# Patient Record
Sex: Male | Born: 1940 | Race: Black or African American | Hispanic: No
Health system: Southern US, Community
[De-identification: ages and names within clinical notes are randomized; demographics above are authoritative.]

---

## 1999-11-08 ENCOUNTER — Encounter: Payer: Self-pay | Admitting: *Deleted

## 1999-11-08 ENCOUNTER — Inpatient Hospital Stay (HOSPITAL_COMMUNITY): Admission: EM | Admit: 1999-11-08 | Discharge: 1999-11-10 | Payer: Self-pay | Admitting: Emergency Medicine

## 1999-11-09 ENCOUNTER — Encounter: Payer: Self-pay | Admitting: Family Medicine

## 1999-11-14 ENCOUNTER — Encounter: Admission: RE | Admit: 1999-11-14 | Discharge: 1999-11-14 | Payer: Self-pay | Admitting: Family Medicine

## 1999-11-18 ENCOUNTER — Encounter: Admission: RE | Admit: 1999-11-18 | Discharge: 1999-11-18 | Payer: Self-pay | Admitting: Family Medicine

## 2000-01-12 ENCOUNTER — Ambulatory Visit (HOSPITAL_COMMUNITY): Admission: RE | Admit: 2000-01-12 | Discharge: 2000-01-12 | Payer: Self-pay | Admitting: Sports Medicine

## 2000-01-13 ENCOUNTER — Encounter: Admission: RE | Admit: 2000-01-13 | Discharge: 2000-01-13 | Payer: Self-pay | Admitting: Family Medicine

## 2000-01-18 ENCOUNTER — Encounter: Admission: RE | Admit: 2000-01-18 | Discharge: 2000-01-18 | Payer: Self-pay | Admitting: Family Medicine

## 2000-01-19 ENCOUNTER — Encounter: Payer: Self-pay | Admitting: *Deleted

## 2000-01-19 ENCOUNTER — Ambulatory Visit (HOSPITAL_COMMUNITY): Admission: RE | Admit: 2000-01-19 | Discharge: 2000-01-19 | Payer: Self-pay | Admitting: *Deleted

## 2000-01-27 ENCOUNTER — Ambulatory Visit (HOSPITAL_COMMUNITY): Admission: RE | Admit: 2000-01-27 | Discharge: 2000-01-27 | Payer: Self-pay | Admitting: *Deleted

## 2000-01-27 ENCOUNTER — Encounter: Payer: Self-pay | Admitting: *Deleted

## 2000-07-27 ENCOUNTER — Encounter: Admission: RE | Admit: 2000-07-27 | Discharge: 2000-07-27 | Payer: Self-pay | Admitting: Family Medicine

## 2000-08-28 ENCOUNTER — Encounter: Admission: RE | Admit: 2000-08-28 | Discharge: 2000-08-28 | Payer: Self-pay | Admitting: Family Medicine

## 2000-10-03 ENCOUNTER — Encounter: Admission: RE | Admit: 2000-10-03 | Discharge: 2000-10-03 | Payer: Self-pay | Admitting: Family Medicine

## 2000-12-06 ENCOUNTER — Encounter: Admission: RE | Admit: 2000-12-06 | Discharge: 2000-12-06 | Payer: Self-pay | Admitting: Family Medicine

## 2000-12-31 ENCOUNTER — Encounter: Admission: RE | Admit: 2000-12-31 | Discharge: 2000-12-31 | Payer: Self-pay | Admitting: Family Medicine

## 2001-01-22 ENCOUNTER — Encounter: Admission: RE | Admit: 2001-01-22 | Discharge: 2001-01-22 | Payer: Self-pay | Admitting: Sports Medicine

## 2001-02-20 ENCOUNTER — Ambulatory Visit (HOSPITAL_COMMUNITY): Admission: RE | Admit: 2001-02-20 | Discharge: 2001-02-20 | Payer: Self-pay | Admitting: Family Medicine

## 2001-02-20 ENCOUNTER — Encounter: Admission: RE | Admit: 2001-02-20 | Discharge: 2001-02-20 | Payer: Self-pay | Admitting: Family Medicine

## 2001-03-08 ENCOUNTER — Encounter: Admission: RE | Admit: 2001-03-08 | Discharge: 2001-03-08 | Payer: Self-pay | Admitting: Family Medicine

## 2001-03-08 ENCOUNTER — Ambulatory Visit (HOSPITAL_COMMUNITY): Admission: RE | Admit: 2001-03-08 | Discharge: 2001-03-08 | Payer: Self-pay | Admitting: Sports Medicine

## 2001-04-03 ENCOUNTER — Encounter: Admission: RE | Admit: 2001-04-03 | Discharge: 2001-04-03 | Payer: Self-pay | Admitting: Family Medicine

## 2001-05-22 ENCOUNTER — Encounter: Admission: RE | Admit: 2001-05-22 | Discharge: 2001-05-22 | Payer: Self-pay | Admitting: Family Medicine

## 2001-05-27 ENCOUNTER — Encounter: Admission: RE | Admit: 2001-05-27 | Discharge: 2001-05-27 | Payer: Self-pay | Admitting: Sports Medicine

## 2001-05-27 ENCOUNTER — Encounter: Payer: Self-pay | Admitting: Sports Medicine

## 2001-06-06 ENCOUNTER — Encounter: Admission: RE | Admit: 2001-06-06 | Discharge: 2001-06-06 | Payer: Self-pay | Admitting: Family Medicine

## 2001-07-26 ENCOUNTER — Encounter: Payer: Self-pay | Admitting: Urology

## 2001-07-26 ENCOUNTER — Encounter: Admission: RE | Admit: 2001-07-26 | Discharge: 2001-07-26 | Payer: Self-pay | Admitting: Urology

## 2001-07-31 ENCOUNTER — Ambulatory Visit (HOSPITAL_BASED_OUTPATIENT_CLINIC_OR_DEPARTMENT_OTHER): Admission: RE | Admit: 2001-07-31 | Discharge: 2001-07-31 | Payer: Self-pay | Admitting: Urology

## 2001-07-31 ENCOUNTER — Encounter (INDEPENDENT_AMBULATORY_CARE_PROVIDER_SITE_OTHER): Payer: Self-pay | Admitting: Specialist

## 2001-08-19 ENCOUNTER — Encounter: Admission: RE | Admit: 2001-08-19 | Discharge: 2001-08-19 | Payer: Self-pay | Admitting: Sports Medicine

## 2001-08-28 ENCOUNTER — Encounter: Admission: RE | Admit: 2001-08-28 | Discharge: 2001-08-28 | Payer: Self-pay | Admitting: Family Medicine

## 2001-09-19 ENCOUNTER — Encounter: Admission: RE | Admit: 2001-09-19 | Discharge: 2001-09-19 | Payer: Self-pay | Admitting: Family Medicine

## 2002-02-24 ENCOUNTER — Encounter: Admission: RE | Admit: 2002-02-24 | Discharge: 2002-02-24 | Payer: Self-pay | Admitting: Sports Medicine

## 2002-02-25 ENCOUNTER — Encounter: Payer: Self-pay | Admitting: Sports Medicine

## 2002-02-25 ENCOUNTER — Encounter: Admission: RE | Admit: 2002-02-25 | Discharge: 2002-02-25 | Payer: Self-pay | Admitting: *Deleted

## 2002-03-07 ENCOUNTER — Encounter: Admission: RE | Admit: 2002-03-07 | Discharge: 2002-03-07 | Payer: Self-pay | Admitting: Family Medicine

## 2002-03-10 ENCOUNTER — Encounter: Admission: RE | Admit: 2002-03-10 | Discharge: 2002-06-08 | Payer: Self-pay | Admitting: *Deleted

## 2002-03-27 ENCOUNTER — Encounter: Admission: RE | Admit: 2002-03-27 | Discharge: 2002-03-27 | Payer: Self-pay | Admitting: Family Medicine

## 2002-07-29 ENCOUNTER — Encounter: Admission: RE | Admit: 2002-07-29 | Discharge: 2002-10-27 | Payer: Self-pay | Admitting: *Deleted

## 2002-08-14 ENCOUNTER — Encounter: Admission: RE | Admit: 2002-08-14 | Discharge: 2002-08-14 | Payer: Self-pay | Admitting: Family Medicine

## 2002-09-25 ENCOUNTER — Encounter: Admission: RE | Admit: 2002-09-25 | Discharge: 2002-09-25 | Payer: Self-pay | Admitting: Sports Medicine

## 2003-05-05 ENCOUNTER — Encounter: Admission: RE | Admit: 2003-05-05 | Discharge: 2003-05-05 | Payer: Self-pay | Admitting: Family Medicine

## 2003-05-05 ENCOUNTER — Ambulatory Visit (HOSPITAL_COMMUNITY): Admission: RE | Admit: 2003-05-05 | Discharge: 2003-05-05 | Payer: Self-pay | Admitting: Sports Medicine

## 2003-05-14 ENCOUNTER — Encounter: Admission: RE | Admit: 2003-05-14 | Discharge: 2003-08-12 | Payer: Self-pay | Admitting: Family Medicine

## 2003-11-24 ENCOUNTER — Encounter: Admission: RE | Admit: 2003-11-24 | Discharge: 2003-11-24 | Payer: Self-pay | Admitting: Family Medicine

## 2004-02-02 ENCOUNTER — Ambulatory Visit: Payer: Self-pay | Admitting: Sports Medicine

## 2004-02-02 ENCOUNTER — Ambulatory Visit (HOSPITAL_COMMUNITY): Admission: RE | Admit: 2004-02-02 | Discharge: 2004-02-02 | Payer: Self-pay | Admitting: Sports Medicine

## 2004-02-23 ENCOUNTER — Encounter: Admission: RE | Admit: 2004-02-23 | Discharge: 2004-02-23 | Payer: Self-pay | Admitting: Sports Medicine

## 2004-03-22 ENCOUNTER — Ambulatory Visit: Payer: Self-pay | Admitting: Family Medicine

## 2004-03-29 ENCOUNTER — Ambulatory Visit: Payer: Self-pay | Admitting: Sports Medicine

## 2004-08-03 ENCOUNTER — Ambulatory Visit: Payer: Self-pay | Admitting: Sports Medicine

## 2004-12-20 ENCOUNTER — Ambulatory Visit: Payer: Self-pay | Admitting: Sports Medicine

## 2004-12-22 ENCOUNTER — Encounter: Admission: RE | Admit: 2004-12-22 | Discharge: 2004-12-22 | Payer: Self-pay | Admitting: Sports Medicine

## 2005-01-17 ENCOUNTER — Ambulatory Visit: Payer: Self-pay | Admitting: Sports Medicine

## 2005-01-23 LAB — CONVERTED CEMR LAB: LDL Cholesterol: 109 mg/dL

## 2005-02-01 ENCOUNTER — Ambulatory Visit: Payer: Self-pay | Admitting: Family Medicine

## 2005-03-22 ENCOUNTER — Ambulatory Visit (HOSPITAL_COMMUNITY): Admission: RE | Admit: 2005-03-22 | Discharge: 2005-03-22 | Payer: Self-pay | Admitting: Sports Medicine

## 2005-03-28 ENCOUNTER — Ambulatory Visit: Payer: Self-pay | Admitting: Sports Medicine

## 2006-06-21 DIAGNOSIS — K219 Gastro-esophageal reflux disease without esophagitis: Secondary | ICD-10-CM | POA: Insufficient documentation

## 2006-06-21 DIAGNOSIS — K449 Diaphragmatic hernia without obstruction or gangrene: Secondary | ICD-10-CM | POA: Insufficient documentation

## 2006-06-21 DIAGNOSIS — E119 Type 2 diabetes mellitus without complications: Secondary | ICD-10-CM

## 2006-06-21 DIAGNOSIS — K4090 Unilateral inguinal hernia, without obstruction or gangrene, not specified as recurrent: Secondary | ICD-10-CM | POA: Insufficient documentation

## 2006-09-01 IMAGING — CR DG CHEST 2V
2 series · 2 of 2 positions shown · non-contrast
Comparison: none

CLINICAL DATA: Cough.  Left sided chest pain.
 CHEST ? TWO VIEWS
 Two views of the chest are compared to a chest x-ray of 07/26/01.  There is no change in slight elevation of the left hemidiaphragm.  No active infiltrate or effusion is seen.  The heart is within normal limits in size.  No bony abnormality is noted.
 IMPRESSION
 Stable chest x-ray.  No active lung disease.

[view not recorded (1 of 2)]
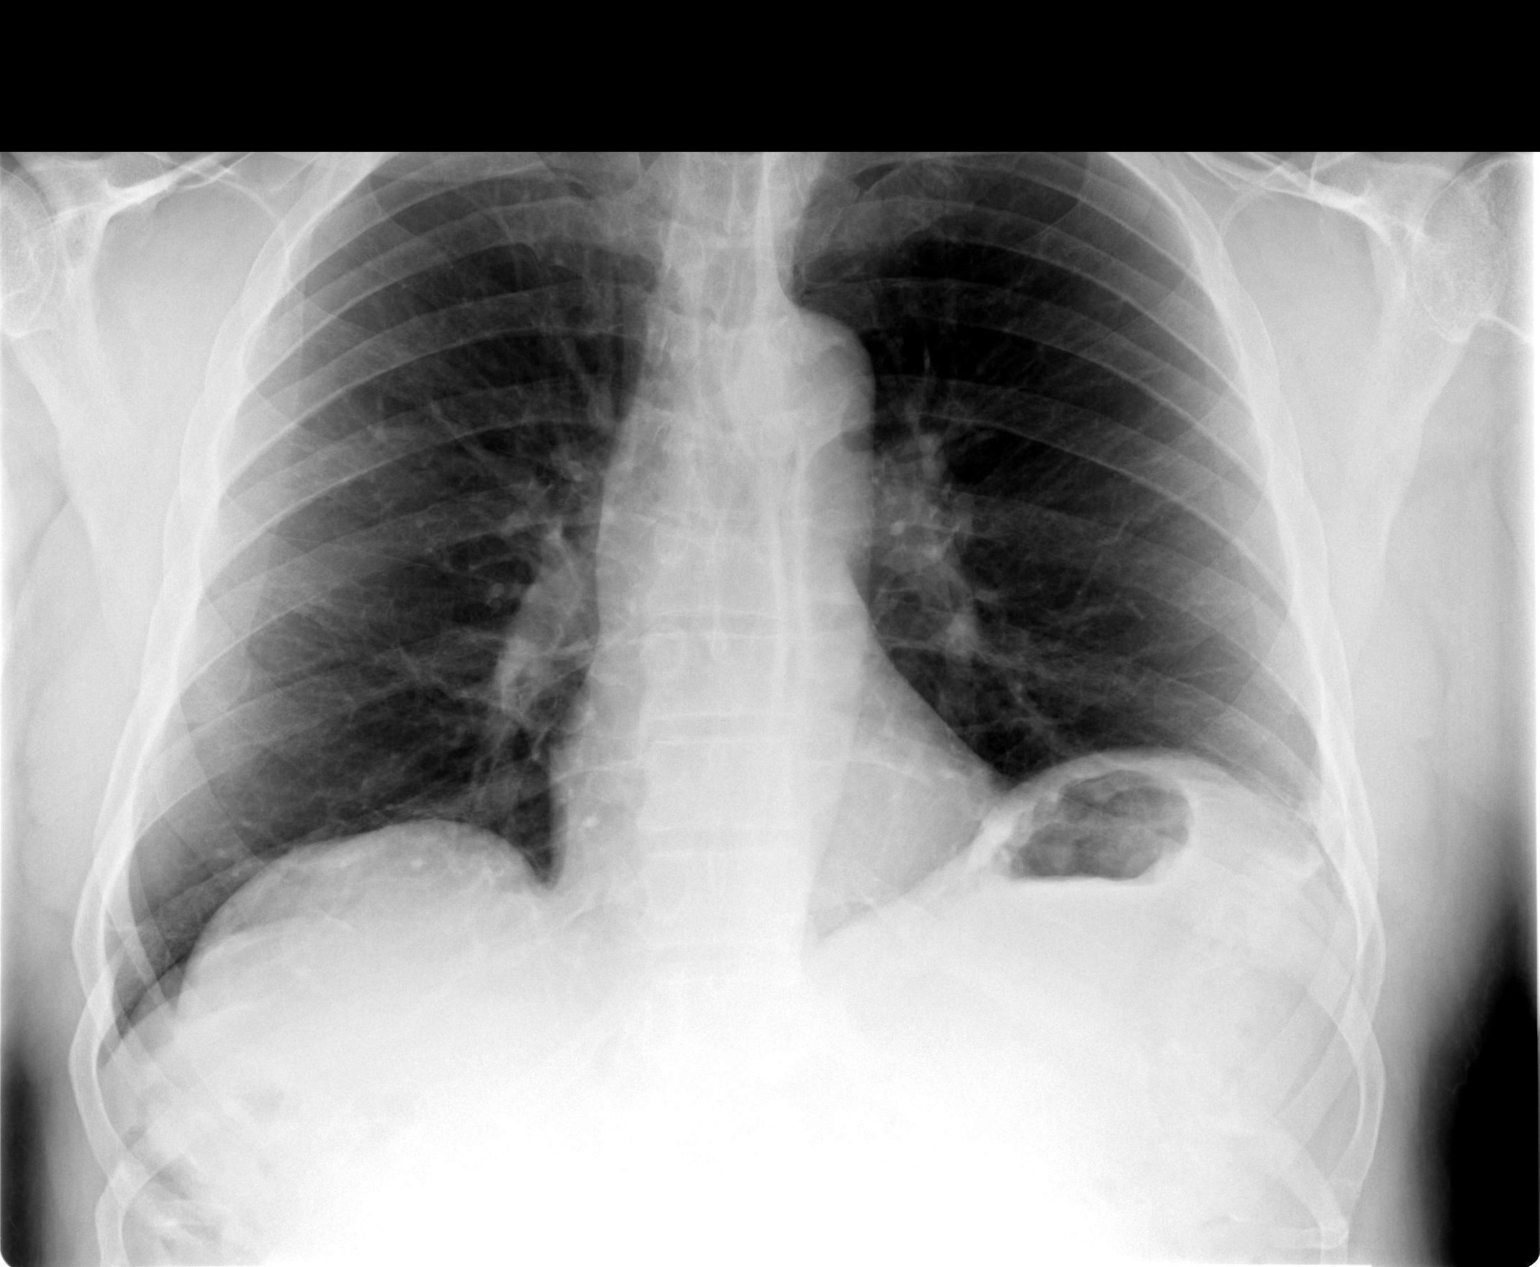

[view not recorded (2 of 2)]
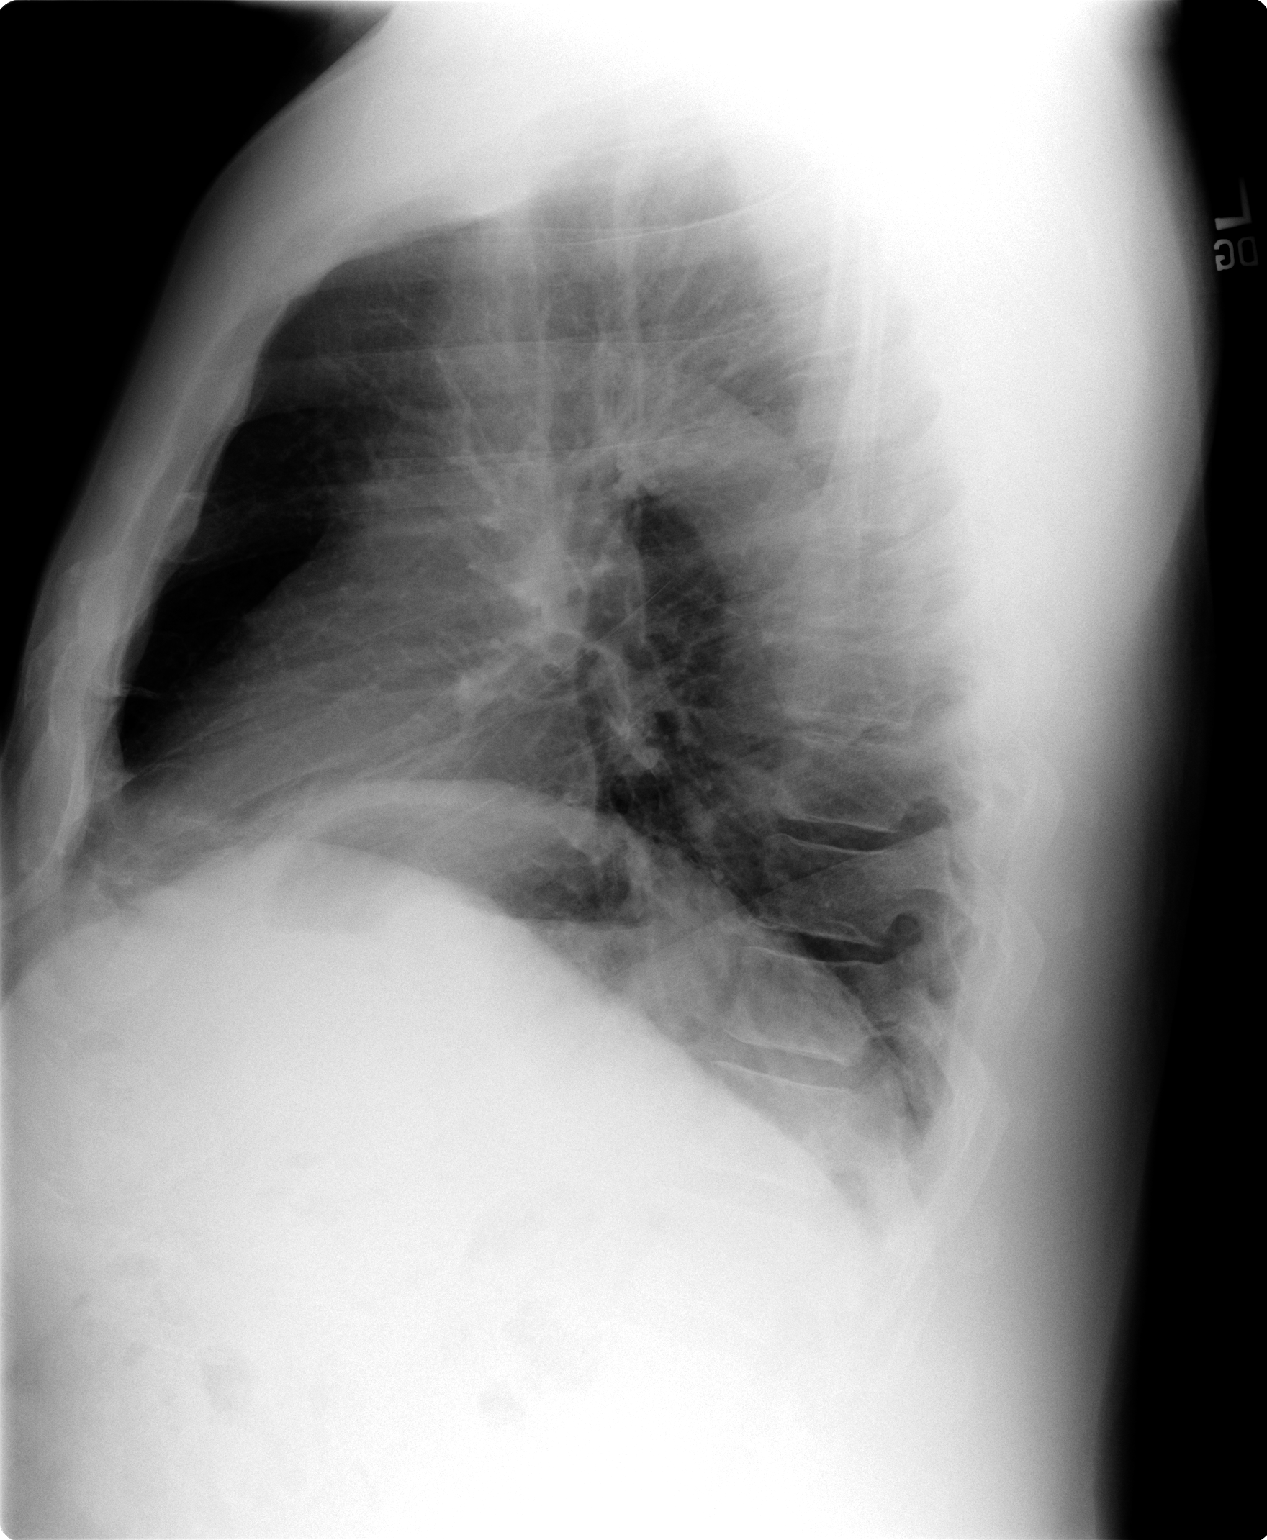

[2 of 2 positions shown; findings below may reference images not displayed]

## 2006-11-26 ENCOUNTER — Encounter: Payer: Self-pay | Admitting: Family Medicine

## 2007-02-08 ENCOUNTER — Telehealth: Payer: Self-pay | Admitting: *Deleted

## 2007-03-01 ENCOUNTER — Telehealth: Payer: Self-pay | Admitting: Family Medicine

## 2007-08-09 ENCOUNTER — Encounter: Payer: Self-pay | Admitting: Family Medicine

## 2007-08-12 ENCOUNTER — Telehealth: Payer: Self-pay | Admitting: Family Medicine

## 2007-09-11 ENCOUNTER — Telehealth: Payer: Self-pay | Admitting: *Deleted

## 2007-09-11 ENCOUNTER — Ambulatory Visit: Payer: Self-pay | Admitting: Family Medicine

## 2007-09-11 DIAGNOSIS — E039 Hypothyroidism, unspecified: Secondary | ICD-10-CM | POA: Insufficient documentation

## 2007-09-11 DIAGNOSIS — H532 Diplopia: Secondary | ICD-10-CM

## 2007-09-11 LAB — CONVERTED CEMR LAB
BUN: 13 mg/dL (ref 6–23)
CO2: 20 meq/L (ref 19–32)
Calcium: 9.2 mg/dL (ref 8.4–10.5)
Creatinine, Ser: 1.12 mg/dL (ref 0.40–1.50)

## 2007-09-12 ENCOUNTER — Encounter: Payer: Self-pay | Admitting: Family Medicine

## 2007-09-17 ENCOUNTER — Telehealth: Payer: Self-pay | Admitting: *Deleted

## 2007-10-07 ENCOUNTER — Encounter: Payer: Self-pay | Admitting: Family Medicine

## 2007-11-25 ENCOUNTER — Telehealth: Payer: Self-pay | Admitting: Family Medicine

## 2007-11-26 ENCOUNTER — Encounter: Payer: Self-pay | Admitting: Family Medicine

## 2007-12-23 ENCOUNTER — Ambulatory Visit: Payer: Self-pay | Admitting: Family Medicine

## 2008-04-22 ENCOUNTER — Encounter: Payer: Self-pay | Admitting: Family Medicine

## 2008-04-22 DIAGNOSIS — E785 Hyperlipidemia, unspecified: Secondary | ICD-10-CM

## 2008-08-05 ENCOUNTER — Ambulatory Visit: Payer: Self-pay | Admitting: Family Medicine

## 2008-08-05 ENCOUNTER — Encounter: Payer: Self-pay | Admitting: Family Medicine

## 2008-08-05 LAB — CONVERTED CEMR LAB
Alkaline Phosphatase: 64 units/L (ref 39–117)
BUN: 13 mg/dL (ref 6–23)
Creatinine, Ser: 1.33 mg/dL (ref 0.40–1.50)
Glucose, Bld: 185 mg/dL — ABNORMAL HIGH (ref 70–99)
HDL: 35 mg/dL — ABNORMAL LOW (ref >39)
LDL Cholesterol: 141 mg/dL — ABNORMAL HIGH (ref 0–99)
Sodium: 138 meq/L (ref 135–145)
Total Bilirubin: 0.3 mg/dL (ref 0.3–1.2)
Total CHOL/HDL Ratio: 7.1
Total Protein: 6.8 g/dL (ref 6.0–8.3)
Triglycerides: 363 mg/dL — ABNORMAL HIGH (ref <150)
VLDL: 73 mg/dL — ABNORMAL HIGH (ref 0–40)

## 2008-08-06 ENCOUNTER — Encounter: Payer: Self-pay | Admitting: Family Medicine

## 2008-09-01 ENCOUNTER — Telehealth (INDEPENDENT_AMBULATORY_CARE_PROVIDER_SITE_OTHER): Payer: Self-pay | Admitting: *Deleted

## 2008-09-14 ENCOUNTER — Telehealth: Payer: Self-pay | Admitting: Family Medicine

## 2010-09-09 NOTE — Op Note (Signed)
[facility]  Patient:    Samuel Holloway, Samuel Holloway Visit Number: [id number] MRN: [id number]          Service Type: NES Location: [facility] Attending Physician:  Thermon Leyland Dictated by:   Barron Alvine, M.D. Proc. Date: 07/31/01 Admit Date:  07/31/2001                             Operative Report  PREOPERATIVE DIAGNOSES: 1. Left spermatocele. 2. Phimosis.  POSTOPERATIVE DIAGNOSES: 1. Left spermatocele. 2. Phimosis.  PROCEDURE PERFORMED:  Circumcision and scrotal exploration with spermatocele excision and partial epididymectomy.  SURGEON:  Barron Alvine, M.D.  ANESTHESIA:  General.  INDICATIONS:  The patient is a 70 year old male.  He presented recently with some complaints of swelling and intermittent discomfort in the left hemiscrotum.  An ultrasound had showed a large spermatocele in the head of the left epididymis.  In addition, the patient had had difficulty for some time with inability to retract his foreskin.  We discussed the entire situation with the patient and strongly considered spermatocele excision with circumcision.  We outlined the advantages as well as the potential risks of this approach.  The patient wanted to proceed with both procedures and full informed consent was obtained.  TECHNIQUE AND FINDINGS:  The patient was brought to the operating room where he had the successful induction of general anesthesia.  He was prepped and draped in the usual manner.  A left mid scrotal incision was performed.  There was a small hydrocele present.  The left testicle was brought out with the epididymis.  The testicle itself was palpably normal.  There was approximately a 4 cm cystic mass superior to the left testicle within the head of the epididymis.  Careful inspection revealed a multilocular spermatocele with some potential cystic degeneration of the head of the epididymis.  We performed a spermatic cord block with Marcaine to lessen  anesthetic requirements.  We also performed a penile block at the same time in anticipation of subsequent circumcision.  Utilizing a combination of sharp and blunt dissection technique, we were able to take off the head of the epididymis containing the spermatocele.  This was really quite entwined in the head of the epididymis and we felt that partial epididymectomy was in his best interest.  We transected the epididymis and suture ligated that.  The very distal portion of the head of the epididymis and this loculated spermatocele were then sent for pathologic confirmation. There was no significant bleeding or other complications or findings.  Care was taken to bring back the testicle into the hemiscrotum, being careful not to have twisted the spermatic cord.  Dartos was closed with interrupted Vicryl and the skin was closed with some interrupted Vicryl.  Attention was then turned to the circumcision.  We were unable to retract his foreskin.  We made a circumferential incision behind the glans penis.  We then performed a dorsal slit and were able to retract the foreskin.  The inner aspect of the penis showed some chronic inflammatory changes with some thickening and pigmentary changes on the glans penis.  We reprepped this area. We then made a separate circumferential incision in the mucosal collar and removed the sleeve of redundant tissue.  Penile skin was then reapproximated with some interrupted Vicryl suture.  A light pressure dressing was applied.  The patient was brought to the recovery room in stable condition and without obvious complications or problems. Dictated  by:   Barron Alvine, M.D. Attending Physician:  Thermon Leyland DD:  07/31/01 TD:  08/01/01 Job: [id number]

## 2010-09-09 NOTE — H&P (Signed)
[place]  Patient:    Samuel Holloway, Samuel Holloway                        MRN: [id number] Adm. Date:  [id number] Disc. Date: [id number] Attending:  Willow Ora Dictator:   Raynelle Jan, M.D.                         History and Physical  PROBLEM LIST: 1. New onset congestive heart failure. 2. Dyspnea on exertion and orthopnea. 3. Lower extremity edema. 4. Gastroesophageal reflux disease with a hiatal hernia.  HISTORY OF PRESENT ILLNESS:  This is a 70 year old African-American male with no previous cardiac history who presents to the ED with a one month history of increasing pedal edema and dyspnea on exertion.  He has had some cough at night and orthopnea.  Sleeping on three pillows for the past few months.  He has experienced some intermittent chest pain; however, this is worse on movement and bending. He has a history of a hiatal hernia and has been having increased reflux and heart burn after eating for several months.  PAST MEDICAL HISTORY:  He had a hiatal hernia that was diagnosed in the 58s with gastroesophageal reflux disease.  He is on no current medications for his GERD.  He is status post an appendectomy three years ago and an inguinal hernia repair in the 60s.  MEDICATIONS:  He takes a multivitamin p.o. q.d. and Maalox p.r.n.  ALLERGIES:  He has no known drug allergies.  FAMILY HISTORY:  His mother had coronary artery disease and diabetes.  His father died of natural causes.  SOCIAL HISTORY:  He works at a [employer]; however, is a nonsmoker, nondrinker and denies drug uses.  He is divorced with three grown children.  REVIEW OF SYSTEMS:  He denies fevers, or chills; however, he has had a 15 pound weight gain over the past two weeks.  He denies any palpitations or chest pain. His respiratory is as per the HPI.  He notes occasional left sided abdominal pain that is intermittent in nature and some heart burn as well as occasional  constipation.  He complaints of dysuria but no hematuria and some occasional nocturia.  He has occasional left arm pain; however, only with certain arm positions. He notes no skin rashes or further neurological deficits.  PHYSICAL EXAMINATION:  VITAL SIGNS:   Temperature 99.0, blood pressure 116/68, pulse 106, respirations 16.  He is saturating 94% on room air.  GENERAL:  This is a pleasant gentleman in no apparent distress who is alert and oriented times four.  HEENT:  Pupils are equal, round and reactive to light.  Extraocular movements intact. He has no scleral icterus.  His oropharynx has dentures in place without erythema or exudate.  NECK:  Supple without JVD or lymphadenopathy.  CARDIOVASCULAR:  He has normal S1 and S2 with a regular rate and rhythm.  No murmurs, rubs or gallops.  LUNGS:  Clear to auscultation bilaterally with no rales, rhonchi, or wheezing.  ABDOMEN:  Soft, nontender, and nondistended.  Positive bowel sounds.  No masses.  EXTREMITIES:  He has 2 to 3+ pitting edema in the bilateral lower extremities to the mid calf.  Pulses are 2+. He has no cyanosis or clubbing.  NEUROLOGIC:  He has no focal deficits.  Musculoskeletal exam is 5/5 throughout.  LABORATORY DATA:  His CBC was within normal limits. His basic  metabolic panel was significant only for glucose of 162.  His pH was 7.449 with a pCO2 of 36.3 and a bicarbonate of 25.  Initial cardiac enzymes showed a CK of 149, MB fraction of 1.0, index of 0.7, troponin less than 0.03.  EKG showed a normal sinus rhythm with insignificant Qs and V4 through V6.  His chest x-ray showed no edema.  No active disease.  ASSESSMENT/PLAN:  This is a 70 year old African-American male with no previous cardiac history who presents with dyspnea on exertion, shortness of breath and pedal edema, most consistent with a congestive heart failure exacerbation.  1. Congestive heart failure.  We will admit, rule out for  myocardial    infarction.  Check an echocardiogram in the a.m. and place him on    telemetry.  He will receive 40 of Lasix intravenous now and will repeat    this later in the evening based on saturation.  Will check pulmonary    function tests given his history and clinical findings consistent more    with right sided failure and will recheck his chest x-ray in the    morning. He will also evaluate his cardiac risk factors further by    checking a fasting lipid panel. 2. Gastroesophageal reflux disease.  We will start him on Pepcid to see    if he has relief.  However, if his gastroesophageal reflux disease    persists we will increase his therapy to a proton pump inhibitor. DD:  11/12/99 TD:  11/13/99 Job: [id number]

## 2010-09-09 NOTE — Discharge Summary (Signed)
[place]. [facility]  Patient:    Samuel Holloway, Samuel Holloway                        MRN: [id number] Adm. Date:  [id number] Disc. Date: [id number] Attending:  Asencion Partridge L Dictator:   Ilene Qua, M.D.                           Discharge Summary  DATE OF BIRTH:  02/14/1941  DISCHARGE DIAGNOSES: 1. Lower extremity edema. 2. Increasing dyspnea on exertion with normal ejection fraction by    echocardiogram on November 09, 1999. 3. Impaired fasting glucose.  PROCEDURES: 1. Transthoracic echocardiogram on November 09, 1999, which revealed a left    ventricular ejection fraction of 60%, a normal right ventricular ejection    fraction, mildly dilated left atrium, and trace mitral regurgitation. 2. 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  CONDITION ON DISCHARGE:  Good.  DISPOSITION:  The patient was discharged to home.  DISCHARGE MEDICATIONS: 1. Lasix 20 mg p.o. q.d. 2. Pepcid 20 mg p.o. b.i.d.  ACTIVITY:  The patient is to increase his level of activity as tolerated.  DIET:  He is to increase his intake of high potassium foods like bananas, etc., as well as try to decrease his consumption of high sugar containing foods.  FOLLOW-UP:  He has a follow-up appointment at the family practice center on Friday, November 18, 1999, at 1:45 p.m. with myself, Ilene Qua, M.D.  HISTORY OF PRESENT ILLNESS:  This patient is a 70 year old African-American male with no cardiac history, who presents to the emergency department with one-month history of increasing pedal edema and increasing dyspnea on exertion.  He does have a cough at night and also had orthopnea.  He sleeps on three pillows for the past few months.  He also stated that he did have intermittent chest pressure, but worse with movement or bending.  PHYSICAL EXAMINATION:  The  temperature was 99.0 degrees, blood pressure 116/68, pulse 106, respiratory rate 16, and he was saturating 94% on room air.  GENERAL APPEARANCE:  He was a pleasant African-American male in no acute distress and alert and oriented.  HEENT:  Pupils equal, round, and reactive to light.  Extraocular movements intact.  No scleral icterus.  The oropharynx was benign with dentures in place.  No erythema or exudate.  NECK:  No JVD or LAD.  CARDIOVASCULAR:  Normal S1 and S2 with a regular rate and rhythm.  No murmurs, rubs, or gallops.  LUNGS:  Clear to auscultation bilaterally.  ABDOMEN:  Soft, nontender, and nondistended with bowel sounds present.  No masses.  EXTREMITIES:  There was 2-3+ pitting edema in the bilateral lower extremities to the mid calf.  Pulses were 2+ bilaterally.  There was no cyanosis or clubbing.  LABORATORY DATA:  The CBC revealed a white count of 3.8, hemoglobin 13.4, hematocrit 39.2, platelet count 169, and a normal differential.  The BMP revealed a sodium of 142, potassium 3.8, chloride 109, bicarbonate 26, BUN 12, creatinine 0.9, and glucose 162.  The CK was 149, the MB was 1.0, and the troponin was less than 0.03.  The EKG revealed normal sinus rhythm with insignificant Qs in V4-V6.  The chest x-ray revealed no edema and no  acute disease.  HOSPITAL COURSE: #1 - QUESTIONABLE NEW ONSET CONGESTIVE HEART FAILURE:  The patient was admitted to rule out for myocardial infarction and was placed on telemetry during his hospital stay.  An echocardiogram was obtained on November 09, 1999, which revealed a normal left ventricular ejection fraction of 60%, as well as normal right ventricular ejection fraction.  The patient was also given Lasix 40 mg IV in the emergency department and had very good diuresis and was given 40 mg p.o. during his hospital stay.  Also, pulmonary function studies were performed given the patients history and clinical findings of being  more consistent with right-sided failure.  These pulmonary function tests revealed that he had some mild obstructive disease.  We also evaluated the cardiac risk factors and checked his fasting lipid profile.  This revealed a cholesterol of 140, triglycerides of 255, an HDL of 22, which was low, and an LDL of 67.  As the patient did not have congestive heart failure by echocardiogram, it was felt that the presentation could represent early onset of cor pulmonale with the symptoms of lower extremity edema and increasing dyspnea on exertion.  He was discharged on a low dose of Lasix 20 mg a day and will be followed up in about a week in the clinic to check a potassium level to see if the swelling has decreased at all.  #2 - GASTROESOPHAGEAL REFLUX DISEASE:  The patient did give a history of hiatal hernia and some epigastric pain.  He was started on Pepcid and this seemed to bring him great relief.  He was discharged on Pepcid 20 mg b.i.d. for this.  #3 - IMPAIRED FASTING GLUCOSE:  The patients glucose on admission was 162.  A fasting glucose the following morning was 118, but due to the patients history of increasing thirst, it was felt that he should have a further work-up of this elevated glucose level as an outpatient.  He was advised to go ahead and watch his intake of foods high in sugar. DD:  11/10/99 TD:  11/14/99 Job: [id number]

## 2022-04-27 ENCOUNTER — Encounter (HOSPITAL_COMMUNITY)
Admission: RE | Admit: 2022-04-27 | Discharge: 2022-04-27 | Disposition: A | Discharge status: Home / Self Care | Payer: No Typology Code available for payment source | Source: Ambulatory Visit | Attending: Nurse Practitioner | Admitting: Nurse Practitioner

## 2022-04-27 VITALS — BP 100/52 | HR 73 | Ht 71.0 in | Wt 191.6 lb

## 2022-04-27 DIAGNOSIS — I2510 Atherosclerotic heart disease of native coronary artery without angina pectoris: Secondary | ICD-10-CM | POA: Insufficient documentation

## 2022-04-27 LAB — GLUCOSE, CAPILLARY: Glucose-Capillary: 175 mg/dL — ABNORMAL HIGH (ref 70–99)

## 2022-04-27 NOTE — Progress Notes (Signed)
Cardiac Individual Treatment Plan  Patient Details  Name: Samuel Holloway. MRN: [id number] Date of Birth: October 06, 1940 Referring Provider:   Flowsheet Row CARDIAC REHAB PHASE II ORIENTATION from 04/27/2022 in [facility]  Referring Provider [name], FNP       Initial Encounter Date:  Flowsheet Row CARDIAC REHAB PHASE II ORIENTATION from 04/27/2022 in [facility]  Date 04/27/22       Visit Diagnosis: Atherosclerosis of native coronary artery of native heart without angina pectoris  Patient's Home Medications on Admission:  Current Outpatient Medications:    aspirin EC 81 MG tablet, Take 81 mg by mouth daily., Disp: , Rfl:    atorvastatin (LIPITOR) 80 MG tablet, Take 40 mg by mouth daily., Disp: , Rfl:    BRILINTA 90 MG TABS tablet, Take 90 mg by mouth 2 times daily at 12 noon and 4 pm., Disp: , Rfl:    Calcium Carb-Cholecalciferol 600-10 MG-MCG TABS, Take 1 tablet by mouth 2 (two) times daily., Disp: , Rfl:    carboxymethylcellulose (REFRESH PLUS) 0.5 % SOLN, Place 1 drop into both eyes at bedtime., Disp: , Rfl:    cholecalciferol (VITAMIN D3) 25 MCG (1000 UNIT) tablet, Take 1,000 Units by mouth daily., Disp: , Rfl:    diclofenac (VOLTAREN) 75 MG EC tablet, Take 75 mg by mouth 2 (two) times daily as needed for mild pain., Disp: , Rfl:    empagliflozin (JARDIANCE) 25 MG TABS tablet, Take 25 mg by mouth daily., Disp: , Rfl:    fluticasone (FLONASE) 50 MCG/ACT nasal spray, Place 1 spray into both nostrils daily., Disp: , Rfl:    hydrochlorothiazide (HYDRODIURIL) 25 MG tablet, Take 12.5 mg by mouth daily., Disp: , Rfl:    levothyroxine (SYNTHROID) 200 MCG tablet, Take 200 mcg by mouth daily before breakfast., Disp: , Rfl:    LYCOPENE PO, Take 1 tablet by mouth daily., Disp: , Rfl:    metFORMIN (GLUCOPHAGE) 1000 MG tablet, Take 1,000 mg by mouth 2 (two) times daily with a meal., Disp: , Rfl:    omeprazole (PRILOSEC) 40 MG capsule, Take 40  mg by mouth daily., Disp: , Rfl:    polyethylene glycol powder (GLYCOLAX/MIRALAX) 17 GM/SCOOP powder, Take 17 g by mouth daily as needed for mild constipation., Disp: , Rfl:    senna-docusate (SENOKOT-S) 8.6-50 MG tablet, Take 2 tablets by mouth at bedtime as needed for mild constipation., Disp: , Rfl:    sertraline (ZOLOFT) 100 MG tablet, Take 100 mg by mouth daily., Disp: , Rfl:    sodium chloride (OCEAN) 0.65 % nasal spray, Place 1 spray into the nose every 2 (two) hours., Disp: , Rfl:    Timolol Maleate, Once-Daily, 0.5 % SOLN, Apply 1 drop to eye daily., Disp: , Rfl:    isosorbide mononitrate (IMDUR) 30 MG 24 hr tablet, Take 30 mg by mouth daily., Disp: , Rfl:    metoprolol succinate (TOPROL-XL) 100 MG 24 hr tablet, Take 50 mg by mouth daily., Disp: , Rfl:    oxybutynin (DITROPAN) 5 MG tablet, Take 5 mg by mouth 2 (two) times daily., Disp: , Rfl:   Past Medical History: No past medical history on file.  Tobacco Use: Social History   Tobacco Use  Smoking Status Not on file  Smokeless Tobacco Not on file    Labs: Review Flowsheet       Latest Ref Rng & Units 01/23/2005 08/05/2008  Labs for ITP Cardiac and Pulmonary Rehab  Cholestrol 0 - 200  mg/dL - 249   LDL (calc) 0 - 99 mg/dL 109  141   HDL-C >39 mg/dL - 35   Trlycerides <150 mg/dL - 363   Hemoglobin A1c % - 6.4     Capillary Blood Glucose: Lab Results  Component Value Date   GLUCAP 175 (H) 04/27/2022    POCT Glucose     Row Name 04/27/22 1024             POCT Blood Glucose   Pre-Exercise 175 mg/dL                Exercise Target Goals: Exercise Program Goal: Individual exercise prescription set using results from initial 6 min walk test and THRR while considering  patient's activity barriers and safety.   Exercise Prescription Goal: Starting with aerobic activity 30 plus minutes a day, 3 days per week for initial exercise prescription. Provide home exercise prescription and guidelines that participant  acknowledges understanding prior to discharge.  Activity Barriers & Risk Stratification:  Activity Barriers & Cardiac Risk Stratification - 04/27/22 0838       Activity Barriers & Cardiac Risk Stratification   Activity Barriers Arthritis;Back Problems;Neck/Spine Problems;Joint Problems;Deconditioning;Muscular Weakness;Balance Concerns;History of Falls;Assistive Device    Cardiac Risk Stratification High             6 Minute Walk:  6 Minute Walk     Row Name 04/27/22 1020         6 Minute Walk   Phase Initial     Distance 1050 feet     Walk Time 6 minutes     # of Rest Breaks 0     MPH 1.99     METS 1.84     RPE 9     VO2 Peak 6.45     Symptoms No     Resting HR 73 bpm     Resting BP 100/52     Resting Oxygen Saturation  99 %     Exercise Oxygen Saturation  during 6 min walk 99 %     Max Ex. HR 86 bpm     Max Ex. BP 122/60     2 Minute Post BP 102/60              Oxygen Initial Assessment:   Oxygen Re-Evaluation:   Oxygen Discharge (Final Oxygen Re-Evaluation):   Initial Exercise Prescription:  Initial Exercise Prescription - 04/27/22 1000       Date of Initial Exercise RX and Referring Provider   Date 04/27/22    Referring Provider [name], FNP    Expected Discharge Date 07/21/22      NuStep   Level 1    SPM 80    Minutes 22      Arm Ergometer   Level 1    RPM 60    Minutes 17      Prescription Details   Frequency (times per week) 3    Duration Progress to 30 minutes of continuous aerobic without signs/symptoms of physical distress      Intensity   THRR 40-80% of Max Heartrate 56-112    Ratings of Perceived Exertion 11-13    Perceived Dyspnea 0-4      Resistance Training   Training Prescription Yes    Weight 4    Reps 10-15             Perform Capillary Blood Glucose checks as needed.  Exercise Prescription Changes:   Exercise Comments:   Exercise Goals  and Review:   Exercise Goals     Row Name  04/27/22 1023             Exercise Goals   Increase Physical Activity Yes       Intervention Provide advice, education, support and counseling about physical activity/exercise needs.;Develop an individualized exercise prescription for aerobic and resistive training based on initial evaluation findings, risk stratification, comorbidities and participant's personal goals.       Expected Outcomes Short Term: Attend rehab on a regular basis to increase amount of physical activity.;Long Term: Add in home exercise to make exercise part of routine and to increase amount of physical activity.;Long Term: Exercising regularly at least 3-5 days a week.       Increase Strength and Stamina Yes       Intervention Provide advice, education, support and counseling about physical activity/exercise needs.;Develop an individualized exercise prescription for aerobic and resistive training based on initial evaluation findings, risk stratification, comorbidities and participant's personal goals.       Expected Outcomes Short Term: Increase workloads from initial exercise prescription for resistance, speed, and METs.;Short Term: Perform resistance training exercises routinely during rehab and add in resistance training at home;Long Term: Improve cardiorespiratory fitness, muscular endurance and strength as measured by increased METs and functional capacity (6MWT)       Able to understand and use rate of perceived exertion (RPE) scale Yes       Intervention Provide education and explanation on how to use RPE scale       Expected Outcomes Short Term: Able to use RPE daily in rehab to express subjective intensity level;Long Term:  Able to use RPE to guide intensity level when exercising independently       Knowledge and understanding of Target Heart Rate Range (THRR) Yes       Intervention Provide education and explanation of THRR including how the numbers were predicted and where they are located for reference        Expected Outcomes Short Term: Able to state/look up THRR;Short Term: Able to use daily as guideline for intensity in rehab;Long Term: Able to use THRR to govern intensity when exercising independently       Able to check pulse independently Yes       Intervention Provide education and demonstration on how to check pulse in carotid and radial arteries.;Review the importance of being able to check your own pulse for safety during independent exercise       Expected Outcomes Short Term: Able to explain why pulse checking is important during independent exercise;Long Term: Able to check pulse independently and accurately       Understanding of Exercise Prescription Yes       Intervention Provide education, explanation, and written materials on patient's individual exercise prescription       Expected Outcomes Short Term: Able to explain program exercise prescription;Long Term: Able to explain home exercise prescription to exercise independently                Exercise Goals Re-Evaluation :    Discharge Exercise Prescription (Final Exercise Prescription Changes):   Nutrition:  Target Goals: Understanding of nutrition guidelines, daily intake of sodium 1500mg , cholesterol 200mg , calories 30% from fat and 7% or less from saturated fats, daily to have 5 or more servings of fruits and vegetables.  Biometrics:  Pre Biometrics - 04/27/22 1023       Pre Biometrics   Height 5\' 11"  (1.803 m)  Weight 191 lb 9.3 oz (86.9 kg)    Waist Circumference 40.5 inches    Hip Circumference 43 inches    Waist to Hip Ratio 0.94 %    BMI (Calculated) 26.73    Triceps Skinfold 23 mm    % Body Fat 29.3 %    Grip Strength 23.1 kg    Flexibility 0 in    Single Leg Stand 0 seconds              Nutrition Therapy Plan and Nutrition Goals:  Nutrition Therapy & Goals - 04/27/22 0842       Intervention Plan   Intervention Nutrition handout(s) given to patient.    Expected Outcomes Short Term Goal:  Understand basic principles of dietary content, such as calories, fat, sodium, cholesterol and nutrients.             Nutrition Assessments:  Nutrition Assessments - 04/27/22 0850       MEDFICTS Scores   Pre Score 51            MEDIFICTS Score Key: ?70 Need to make dietary changes  40-70 Heart Healthy Diet ? 40 Therapeutic Level Cholesterol Diet   Picture Your Plate Scores: D34-534 Unhealthy dietary pattern with much room for improvement. 41-50 Dietary pattern unlikely to meet recommendations for good health and room for improvement. 51-60 More healthful dietary pattern, with some room for improvement.  >60 Healthy dietary pattern, although there may be some specific behaviors that could be improved.    Nutrition Goals Re-Evaluation:   Nutrition Goals Discharge (Final Nutrition Goals Re-Evaluation):   Psychosocial: Target Goals: Acknowledge presence or absence of significant depression and/or stress, maximize coping skills, provide positive support system. Participant is able to verbalize types and ability to use techniques and skills needed for reducing stress and depression.  Initial Review & Psychosocial Screening:  Initial Psych Review & Screening - 04/27/22 0840       Initial Review   Current issues with None Identified      Family Dynamics   Good Support System? Yes    Comments His daughter is his support system.      Barriers   Psychosocial barriers to participate in program There are no identifiable barriers or psychosocial needs.      Screening Interventions   Interventions Encouraged to exercise    Expected Outcomes Long Term goal: The participant improves quality of Life and PHQ9 Scores as seen by post scores and/or verbalization of changes;Short Term goal: Identification and review with participant of any Quality of Life or Depression concerns found by scoring the questionnaire.             Quality of Life Scores:  Quality of Life - 04/27/22  1020       Quality of Life   Select Quality of Life      Quality of Life Scores   Health/Function Pre 22.15 %    Socioeconomic Pre 22.5 %    Psych/Spiritual Pre 27.43 %    Family Pre 19.2 %    GLOBAL Pre 22.97 %            Scores of 19 and below usually indicate a poorer quality of life in these areas.  A difference of  2-3 points is a clinically meaningful difference.  A difference of 2-3 points in the total score of the Quality of Life Index has been associated with significant improvement in overall quality of life, self-image, physical symptoms, and general health in  studies assessing change in quality of life.  PHQ-9: Review Flowsheet       04/27/2022  Depression screen PHQ 2/9  Decreased Interest 0  Down, Depressed, Hopeless 0  PHQ - 2 Score 0  Altered sleeping 0  Tired, decreased energy 1  Change in appetite 1  Feeling bad or failure about yourself  0  Trouble concentrating 0  Moving slowly or fidgety/restless 0  Suicidal thoughts 0  PHQ-9 Score 2  Difficult doing work/chores Somewhat difficult   Interpretation of Total Score  Total Score Depression Severity:  1-4 = Minimal depression, 5-9 = Mild depression, 10-14 = Moderate depression, 15-19 = Moderately severe depression, 20-27 = Severe depression   Psychosocial Evaluation and Intervention:  Psychosocial Evaluation - 04/27/22 1506       Psychosocial Evaluation & Interventions   Interventions Encouraged to exercise with the program and follow exercise prescription;Relaxation education;Stress management education    Comments Pt has no barriers to participating in CR. He has no identifiable psychosocial issues. He scored a 2 on his PHQ-9, and this relates to his poor appetite and his lack of energy. He lives by himself and he frequently goes to [place] to eat out, and due to this he eats a lot of fried foods. He lists his supports system as his daughter. He has several children and grand children who live in  [place], but he only routinely sees one of his daughters. He reports that his goals while in the program are to increase his strength and stamina. He was excited when he was fitted on the machines. He states that he used to exercise and go to the gym a lot when his was in the [employer] in his younger years.    Expected Outcomes Pt will continue to have no identifiable psychosocial issues.    Continue Psychosocial Services  No Follow up required             Psychosocial Re-Evaluation:   Psychosocial Discharge (Final Psychosocial Re-Evaluation):   Vocational Rehabilitation: Provide vocational rehab assistance to qualifying candidates.   Vocational Rehab Evaluation & Intervention:  Vocational Rehab - 04/27/22 0854       Initial Vocational Rehab Evaluation & Intervention   Assessment shows need for Vocational Rehabilitation No      Vocational Rehab Re-Evaulation   Comments retired             Education: Education Goals: Education classes will be provided on a weekly basis, covering required topics. Participant will state understanding/return demonstration of topics presented.  Learning Barriers/Preferences:  Learning Barriers/Preferences - 04/27/22 0850       Learning Barriers/Preferences   Learning Barriers Sight    Learning Preferences Audio;Group Instruction;Individual Instruction;Skilled Demonstration;Pictoral;Verbal Instruction;Written Material             Education Topics: Hypertension, Hypertension Reduction -Define heart disease and high blood pressure. Discus how high blood pressure affects the body and ways to reduce high blood pressure.   Exercise and Your Heart -Discuss why it is important to exercise, the FITT principles of exercise, normal and abnormal responses to exercise, and how to exercise safely.   Angina -Discuss definition of angina, causes of angina, treatment of angina, and how to decrease risk of having angina.   Cardiac  Medications -Review what the following cardiac medications are used for, how they affect the body, and side effects that may occur when taking the medications.  Medications include Aspirin, Beta blockers, calcium channel blockers, ACE Inhibitors, angiotensin receptor blockers, diuretics,  digoxin, and antihyperlipidemics.   Congestive Heart Failure -Discuss the definition of CHF, how to live with CHF, the signs and symptoms of CHF, and how keep track of weight and sodium intake.   Heart Disease and Intimacy -Discus the effect sexual activity has on the heart, how changes occur during intimacy as we age, and safety during sexual activity.   Smoking Cessation / COPD -Discuss different methods to quit smoking, the health benefits of quitting smoking, and the definition of COPD.   Nutrition I: Fats -Discuss the types of cholesterol, what cholesterol does to the heart, and how cholesterol levels can be controlled.   Nutrition II: Labels -Discuss the different components of food labels and how to read food label   Heart Parts/Heart Disease and PAD -Discuss the anatomy of the heart, the pathway of blood circulation through the heart, and these are affected by heart disease.   Stress I: Signs and Symptoms -Discuss the causes of stress, how stress may lead to anxiety and depression, and ways to limit stress.   Stress II: Relaxation -Discuss different types of relaxation techniques to limit stress.   Warning Signs of Stroke / TIA -Discuss definition of a stroke, what the signs and symptoms are of a stroke, and how to identify when someone is having stroke.   Knowledge Questionnaire Score:  Knowledge Questionnaire Score - 04/27/22 0851       Knowledge Questionnaire Score   Pre Score 18/24             Core Components/Risk Factors/Patient Goals at Admission:  Personal Goals and Risk Factors at Admission - 04/27/22 0855       Core Components/Risk Factors/Patient Goals on  Admission   Diabetes Yes    Intervention Provide education about signs/symptoms and action to take for hypo/hyperglycemia.;Provide education about proper nutrition, including hydration, and aerobic/resistive exercise prescription along with prescribed medications to achieve blood glucose in normal ranges: Fasting glucose 65-99 mg/dL    Expected Outcomes Short Term: Participant verbalizes understanding of the signs/symptoms and immediate care of hyper/hypoglycemia, proper foot care and importance of medication, aerobic/resistive exercise and nutrition plan for blood glucose control.;Long Term: Attainment of HbA1C < 7%.    Hypertension Yes    Intervention Provide education on lifestyle modifcations including regular physical activity/exercise, weight management, moderate sodium restriction and increased consumption of fresh fruit, vegetables, and low fat dairy, alcohol moderation, and smoking cessation.;Monitor prescription use compliance.    Expected Outcomes Short Term: Continued assessment and intervention until BP is < 140/60mm HG in hypertensive participants. < 130/87mm HG in hypertensive participants with diabetes, heart failure or chronic kidney disease.;Long Term: Maintenance of blood pressure at goal levels.    Lipids Yes    Intervention Provide education and support for participant on nutrition & aerobic/resistive exercise along with prescribed medications to achieve LDL 70mg , HDL >40mg .    Expected Outcomes Short Term: Participant states understanding of desired cholesterol values and is compliant with medications prescribed. Participant is following exercise prescription and nutrition guidelines.;Long Term: Cholesterol controlled with medications as prescribed, with individualized exercise RX and with personalized nutrition plan. Value goals: LDL < 70mg , HDL > 40 mg.    Personal Goal Other Yes    Personal Goal Increase Strength and stamina    Intervention Attend CR and exercise at home at  least two days per week    Expected Outcomes Pt will meet stated goals.             Core Components/Risk Factors/Patient Goals  Review:    Core Components/Risk Factors/Patient Goals at Discharge (Final Review):    ITP Comments:   Comments: Comments: Patient arrived for 1st visit/orientation/education at 0800. Patient was referred to CR by Concha Pyo, FNP due to atherosclerosis of native coronary artery of native heart without angina pectoris (I25.10). During orientation advised patient on arrival and appointment times what to wear, what to do before, during and after exercise. Reviewed attendance and class policy.  Pt is scheduled to return Cardiac Rehab on 05/01/2022 at 0930. Pt was advised to come to class 15 minutes before class starts.  Discussed RPE/Dpysnea scales. Patient participated in warm up stretches. Patient was able to complete 6 minute walk test.  Telemetry: NSR. Patient was measured for the equipment. Discussed equipment safety with patient. Took patient pre-anthropometric measurements. Patient finished visit at 1000.

## 2022-04-27 NOTE — Progress Notes (Deleted)
Cardiac Individual Treatment Plan  Patient Details  Name: Samuel Holloway. MRN: [id number] Date of Birth: October 06, 1940 Referring Provider:   Flowsheet Row CARDIAC REHAB PHASE II ORIENTATION from 04/27/2022 in [facility]  Referring Provider [name], FNP       Initial Encounter Date:  Flowsheet Row CARDIAC REHAB PHASE II ORIENTATION from 04/27/2022 in [facility]  Date 04/27/22       Visit Diagnosis: Atherosclerosis of native coronary artery of native heart without angina pectoris  Patient's Home Medications on Admission:  Current Outpatient Medications:    aspirin EC 81 MG tablet, Take 81 mg by mouth daily., Disp: , Rfl:    atorvastatin (LIPITOR) 80 MG tablet, Take 40 mg by mouth daily., Disp: , Rfl:    BRILINTA 90 MG TABS tablet, Take 90 mg by mouth 2 times daily at 12 noon and 4 pm., Disp: , Rfl:    Calcium Carb-Cholecalciferol 600-10 MG-MCG TABS, Take 1 tablet by mouth 2 (two) times daily., Disp: , Rfl:    carboxymethylcellulose (REFRESH PLUS) 0.5 % SOLN, Place 1 drop into both eyes at bedtime., Disp: , Rfl:    cholecalciferol (VITAMIN D3) 25 MCG (1000 UNIT) tablet, Take 1,000 Units by mouth daily., Disp: , Rfl:    diclofenac (VOLTAREN) 75 MG EC tablet, Take 75 mg by mouth 2 (two) times daily as needed for mild pain., Disp: , Rfl:    empagliflozin (JARDIANCE) 25 MG TABS tablet, Take 25 mg by mouth daily., Disp: , Rfl:    fluticasone (FLONASE) 50 MCG/ACT nasal spray, Place 1 spray into both nostrils daily., Disp: , Rfl:    hydrochlorothiazide (HYDRODIURIL) 25 MG tablet, Take 12.5 mg by mouth daily., Disp: , Rfl:    levothyroxine (SYNTHROID) 200 MCG tablet, Take 200 mcg by mouth daily before breakfast., Disp: , Rfl:    LYCOPENE PO, Take 1 tablet by mouth daily., Disp: , Rfl:    metFORMIN (GLUCOPHAGE) 1000 MG tablet, Take 1,000 mg by mouth 2 (two) times daily with a meal., Disp: , Rfl:    omeprazole (PRILOSEC) 40 MG capsule, Take 40  mg by mouth daily., Disp: , Rfl:    polyethylene glycol powder (GLYCOLAX/MIRALAX) 17 GM/SCOOP powder, Take 17 g by mouth daily as needed for mild constipation., Disp: , Rfl:    senna-docusate (SENOKOT-S) 8.6-50 MG tablet, Take 2 tablets by mouth at bedtime as needed for mild constipation., Disp: , Rfl:    sertraline (ZOLOFT) 100 MG tablet, Take 100 mg by mouth daily., Disp: , Rfl:    sodium chloride (OCEAN) 0.65 % nasal spray, Place 1 spray into the nose every 2 (two) hours., Disp: , Rfl:    Timolol Maleate, Once-Daily, 0.5 % SOLN, Apply 1 drop to eye daily., Disp: , Rfl:    isosorbide mononitrate (IMDUR) 30 MG 24 hr tablet, Take 30 mg by mouth daily., Disp: , Rfl:    metoprolol succinate (TOPROL-XL) 100 MG 24 hr tablet, Take 50 mg by mouth daily., Disp: , Rfl:    oxybutynin (DITROPAN) 5 MG tablet, Take 5 mg by mouth 2 (two) times daily., Disp: , Rfl:   Past Medical History: No past medical history on file.  Tobacco Use: Social History   Tobacco Use  Smoking Status Not on file  Smokeless Tobacco Not on file    Labs: Review Flowsheet       Latest Ref Rng & Units 01/23/2005 08/05/2008  Labs for [facility]P Cardiac and Pulmonary Rehab  Cholestrol 0 - 200  mg/dL - 249   LDL (calc) 0 - 99 mg/dL 109  141   HDL-C >39 mg/dL - 35   Trlycerides <150 mg/dL - 363   Hemoglobin A1c % - 6.4     Capillary Blood Glucose: Lab Results  Component Value Date   GLUCAP 175 (H) 04/27/2022    POCT Glucose     Row Name 04/27/22 1024             POCT Blood Glucose   Pre-Exercise 175 mg/dL                Exercise Target Goals: Exercise Program Goal: Individual exercise prescription set using results from initial 6 min walk test and THRR while considering  patient's activity barriers and safety.   Exercise Prescription Goal: Starting with aerobic activity 30 plus minutes a day, 3 days per week for initial exercise prescription. Provide home exercise prescription and guidelines that participant  acknowledges understanding prior to discharge.  Activity Barriers & Risk Stratification:  Activity Barriers & Cardiac Risk Stratification - 04/27/22 0838       Activity Barriers & Cardiac Risk Stratification   Activity Barriers Arthritis;Back Problems;Neck/Spine Problems;Joint Problems;Deconditioning;Muscular Weakness;Balance Concerns;History of Falls;Assistive Device    Cardiac Risk Stratification High             6 Minute Walk:  6 Minute Walk     Row Name 04/27/22 1020         6 Minute Walk   Phase Initial     Distance 1050 feet     Walk Time 6 minutes     # of Rest Breaks 0     MPH 1.99     METS 1.84     RPE 9     VO2 Peak 6.45     Symptoms No     Resting HR 73 bpm     Resting BP 100/52     Resting Oxygen Saturation  99 %     Exercise Oxygen Saturation  during 6 min walk 99 %     Max Ex. HR 86 bpm     Max Ex. BP 122/60     2 Minute Post BP 102/60              Oxygen Initial Assessment:   Oxygen Re-Evaluation:   Oxygen Discharge (Final Oxygen Re-Evaluation):   Initial Exercise Prescription:  Initial Exercise Prescription - 04/27/22 1000       Date of Initial Exercise RX and Referring Provider   Date 04/27/22    Referring Provider [name], FNP    Expected Discharge Date 07/21/22      NuStep   Level 1    SPM 80    Minutes 22      Arm Ergometer   Level 1    RPM 60    Minutes 17      Prescription Details   Frequency (times per week) 3    Duration Progress to 30 minutes of continuous aerobic without signs/symptoms of physical distress      Intensity   THRR 40-80% of Max Heartrate 56-112    Ratings of Perceived Exertion 11-13    Perceived Dyspnea 0-4      Resistance Training   Training Prescription Yes    Weight 4    Reps 10-15             Perform Capillary Blood Glucose checks as needed.  Exercise Prescription Changes:   Exercise Comments:   Exercise Goals  and Review:   Exercise Goals     Row Name  04/27/22 1023             Exercise Goals   Increase Physical Activity Yes       Intervention Provide advice, education, support and counseling about physical activity/exercise needs.;Develop an individualized exercise prescription for aerobic and resistive training based on initial evaluation findings, risk stratification, comorbidities and participant's personal goals.       Expected Outcomes Short Term: Attend rehab on a regular basis to increase amount of physical activity.;Long Term: Add in home exercise to make exercise part of routine and to increase amount of physical activity.;Long Term: Exercising regularly at least 3-5 days a week.       Increase Strength and Stamina Yes       Intervention Provide advice, education, support and counseling about physical activity/exercise needs.;Develop an individualized exercise prescription for aerobic and resistive training based on initial evaluation findings, risk stratification, comorbidities and participant's personal goals.       Expected Outcomes Short Term: Increase workloads from initial exercise prescription for resistance, speed, and METs.;Short Term: Perform resistance training exercises routinely during rehab and add in resistance training at home;Long Term: Improve cardiorespiratory fitness, muscular endurance and strength as measured by increased METs and functional capacity (6MWT)       Able to understand and use rate of perceived exertion (RPE) scale Yes       Intervention Provide education and explanation on how to use RPE scale       Expected Outcomes Short Term: Able to use RPE daily in rehab to express subjective intensity level;Long Term:  Able to use RPE to guide intensity level when exercising independently       Knowledge and understanding of Target Heart Rate Range (THRR) Yes       Intervention Provide education and explanation of THRR including how the numbers were predicted and where they are located for reference        Expected Outcomes Short Term: Able to state/look up THRR;Short Term: Able to use daily as guideline for intensity in rehab;Long Term: Able to use THRR to govern intensity when exercising independently       Able to check pulse independently Yes       Intervention Provide education and demonstration on how to check pulse in carotid and radial arteries.;Review the importance of being able to check your own pulse for safety during independent exercise       Expected Outcomes Short Term: Able to explain why pulse checking is important during independent exercise;Long Term: Able to check pulse independently and accurately       Understanding of Exercise Prescription Yes       Intervention Provide education, explanation, and written materials on patient's individual exercise prescription       Expected Outcomes Short Term: Able to explain program exercise prescription;Long Term: Able to explain home exercise prescription to exercise independently                Exercise Goals Re-Evaluation :    Discharge Exercise Prescription (Final Exercise Prescription Changes):   Nutrition:  Target Goals: Understanding of nutrition guidelines, daily intake of sodium 1500mg , cholesterol 200mg , calories 30% from fat and 7% or less from saturated fats, daily to have 5 or more servings of fruits and vegetables.  Biometrics:  Pre Biometrics - 04/27/22 1023       Pre Biometrics   Height 5\' 11"  (1.803 m)  Weight 191 lb 9.3 oz (86.9 kg)    Waist Circumference 40.5 inches    Hip Circumference 43 inches    Waist to Hip Ratio 0.94 %    BMI (Calculated) 26.73    Triceps Skinfold 23 mm    % Body Fat 29.3 %    Grip Strength 23.1 kg    Flexibility 0 in    Single Leg Stand 0 seconds              Nutrition Therapy Plan and Nutrition Goals:  Nutrition Therapy & Goals - 04/27/22 0842       Intervention Plan   Intervention Nutrition handout(s) given to patient.    Expected Outcomes Short Term Goal:  Understand basic principles of dietary content, such as calories, fat, sodium, cholesterol and nutrients.             Nutrition Assessments:  Nutrition Assessments - 04/27/22 0850       MEDFICTS Scores   Pre Score 51            MEDIFICTS Score Key: ?70 Need to make dietary changes  40-70 Heart Healthy Diet ? 40 Therapeutic Level Cholesterol Diet   Picture Your Plate Scores: D34-534 Unhealthy dietary pattern with much room for improvement. 41-50 Dietary pattern unlikely to meet recommendations for good health and room for improvement. 51-60 More healthful dietary pattern, with some room for improvement.  >60 Healthy dietary pattern, although there may be some specific behaviors that could be improved.    Nutrition Goals Re-Evaluation:   Nutrition Goals Discharge (Final Nutrition Goals Re-Evaluation):   Psychosocial: Target Goals: Acknowledge presence or absence of significant depression and/or stress, maximize coping skills, provide positive support system. Participant is able to verbalize types and ability to use techniques and skills needed for reducing stress and depression.  Initial Review & Psychosocial Screening:  Initial Psych Review & Screening - 04/27/22 0840       Initial Review   Current issues with None Identified      Family Dynamics   Good Support System? Yes    Comments His daughter is his support system.      Barriers   Psychosocial barriers to participate in program There are no identifiable barriers or psychosocial needs.      Screening Interventions   Interventions Encouraged to exercise    Expected Outcomes Long Term goal: The participant improves quality of Life and PHQ9 Scores as seen by post scores and/or verbalization of changes;Short Term goal: Identification and review with participant of any Quality of Life or Depression concerns found by scoring the questionnaire.             Quality of Life Scores:  Quality of Life - 04/27/22  1020       Quality of Life   Select Quality of Life      Quality of Life Scores   Health/Function Pre 22.15 %    Socioeconomic Pre 22.5 %    Psych/Spiritual Pre 27.43 %    Family Pre 19.2 %    GLOBAL Pre 22.97 %            Scores of 19 and below usually indicate a poorer quality of life in these areas.  A difference of  2-3 points is a clinically meaningful difference.  A difference of 2-3 points in the total score of the Quality of Life Index has been associated with significant improvement in overall quality of life, self-image, physical symptoms, and general health in  studies assessing change in quality of life.  PHQ-9: Review Flowsheet       04/27/2022  Depression screen PHQ 2/9  Decreased Interest 0  Down, Depressed, Hopeless 0  PHQ - 2 Score 0  Altered sleeping 0  Tired, decreased energy 1  Change in appetite 1  Feeling bad or failure about yourself  0  Trouble concentrating 0  Moving slowly or fidgety/restless 0  Suicidal thoughts 0  PHQ-9 Score 2  Difficult doing work/chores Somewhat difficult   Interpretation of Total Score  Total Score Depression Severity:  1-4 = Minimal depression, 5-9 = Mild depression, 10-14 = Moderate depression, 15-19 = Moderately severe depression, 20-27 = Severe depression   Psychosocial Evaluation and Intervention:   Psychosocial Re-Evaluation:   Psychosocial Discharge (Final Psychosocial Re-Evaluation):   Vocational Rehabilitation: Provide vocational rehab assistance to qualifying candidates.   Vocational Rehab Evaluation & Intervention:  Vocational Rehab - 04/27/22 0854       Initial Vocational Rehab Evaluation & Intervention   Assessment shows need for Vocational Rehabilitation No      Vocational Rehab Re-Evaulation   Comments retired             Education: Education Goals: Education classes will be provided on a weekly basis, covering required topics. Participant will state understanding/return demonstration  of topics presented.  Learning Barriers/Preferences:  Learning Barriers/Preferences - 04/27/22 0850       Learning Barriers/Preferences   Learning Barriers Sight    Learning Preferences Audio;Group Instruction;Individual Instruction;Skilled Demonstration;Pictoral;Verbal Instruction;Written Material             Education Topics: Hypertension, Hypertension Reduction -Define heart disease and high blood pressure. Discus how high blood pressure affects the body and ways to reduce high blood pressure.   Exercise and Your Heart -Discuss why it is important to exercise, the FITT principles of exercise, normal and abnormal responses to exercise, and how to exercise safely.   Angina -Discuss definition of angina, causes of angina, treatment of angina, and how to decrease risk of having angina.   Cardiac Medications -Review what the following cardiac medications are used for, how they affect the body, and side effects that may occur when taking the medications.  Medications include Aspirin, Beta blockers, calcium channel blockers, ACE Inhibitors, angiotensin receptor blockers, diuretics, digoxin, and antihyperlipidemics.   Congestive Heart Failure -Discuss the definition of CHF, how to live with CHF, the signs and symptoms of CHF, and how keep track of weight and sodium intake.   Heart Disease and Intimacy -Discus the effect sexual activity has on the heart, how changes occur during intimacy as we age, and safety during sexual activity.   Smoking Cessation / COPD -Discuss different methods to quit smoking, the health benefits of quitting smoking, and the definition of COPD.   Nutrition I: Fats -Discuss the types of cholesterol, what cholesterol does to the heart, and how cholesterol levels can be controlled.   Nutrition II: Labels -Discuss the different components of food labels and how to read food label   Heart Parts/Heart Disease and PAD -Discuss the anatomy of the  heart, the pathway of blood circulation through the heart, and these are affected by heart disease.   Stress I: Signs and Symptoms -Discuss the causes of stress, how stress may lead to anxiety and depression, and ways to limit stress.   Stress II: Relaxation -Discuss different types of relaxation techniques to limit stress.   Warning Signs of Stroke / TIA -Discuss definition of a stroke, what  the signs and symptoms are of a stroke, and how to identify when someone is having stroke.   Knowledge Questionnaire Score:  Knowledge Questionnaire Score - 04/27/22 0851       Knowledge Questionnaire Score   Pre Score 18/24             Core Components/Risk Factors/Patient Goals at Admission:  Personal Goals and Risk Factors at Admission - 04/27/22 0855       Core Components/Risk Factors/Patient Goals on Admission   Diabetes Yes    Intervention Provide education about signs/symptoms and action to take for hypo/hyperglycemia.;Provide education about proper nutrition, including hydration, and aerobic/resistive exercise prescription along with prescribed medications to achieve blood glucose in normal ranges: Fasting glucose 65-99 mg/dL    Expected Outcomes Short Term: Participant verbalizes understanding of the signs/symptoms and immediate care of hyper/hypoglycemia, proper foot care and importance of medication, aerobic/resistive exercise and nutrition plan for blood glucose control.;Long Term: Attainment of HbA1C < 7%.    Hypertension Yes    Intervention Provide education on lifestyle modifcations including regular physical activity/exercise, weight management, moderate sodium restriction and increased consumption of fresh fruit, vegetables, and low fat dairy, alcohol moderation, and smoking cessation.;Monitor prescription use compliance.    Expected Outcomes Short Term: Continued assessment and intervention until BP is < 140/10mm HG in hypertensive participants. < 130/79mm HG in  hypertensive participants with diabetes, heart failure or chronic kidney disease.;Long Term: Maintenance of blood pressure at goal levels.    Lipids Yes    Intervention Provide education and support for participant on nutrition & aerobic/resistive exercise along with prescribed medications to achieve LDL 70mg , HDL >40mg .    Expected Outcomes Short Term: Participant states understanding of desired cholesterol values and is compliant with medications prescribed. Participant is following exercise prescription and nutrition guidelines.;Long Term: Cholesterol controlled with medications as prescribed, with individualized exercise RX and with personalized nutrition plan. Value goals: LDL < 70mg , HDL > 40 mg.    Personal Goal Other Yes    Personal Goal Increase Strength and stamina    Intervention Attend CR and exercise at home at least two days per week    Expected Outcomes Pt will meet stated goals.             Core Components/Risk Factors/Patient Goals Review:    Core Components/Risk Factors/Patient Goals at Discharge (Final Review):    ITP Comments:   Comments: Patient arrived for 1st visit/orientation/education at 0800. Patient was referred to CR by [name], FNP due to atherosclerosis of native coronary artery of native heart without angina pectoris (I25.10). During orientation advised patient on arrival and appointment times what to wear, what to do before, during and after exercise. Reviewed attendance and class policy.  Pt is scheduled to return Cardiac Rehab on 05/01/2022 at 0930. Pt was advised to come to class 15 minutes before class starts.  Discussed RPE/Dpysnea scales. Patient participated in warm up stretches. Patient was able to complete 6 minute walk test.  Telemetry: NSR. Patient was measured for the equipment. Discussed equipment safety with patient. Took patient pre-anthropometric measurements. Patient finished visit at 1000.

## 2022-05-01 ENCOUNTER — Encounter (HOSPITAL_COMMUNITY)
Admission: RE | Admit: 2022-05-01 | Discharge: 2022-05-01 | Disposition: A | Discharge status: Home / Self Care | Payer: No Typology Code available for payment source | Source: Ambulatory Visit | Attending: Nurse Practitioner | Admitting: Nurse Practitioner

## 2022-05-01 ENCOUNTER — Ambulatory Visit (HOSPITAL_COMMUNITY): Payer: No Typology Code available for payment source

## 2022-05-01 DIAGNOSIS — I2510 Atherosclerotic heart disease of native coronary artery without angina pectoris: Secondary | ICD-10-CM | POA: Diagnosis not present

## 2022-05-01 LAB — GLUCOSE, CAPILLARY: Glucose-Capillary: 211 mg/dL — ABNORMAL HIGH (ref 70–99)

## 2022-05-01 NOTE — Progress Notes (Deleted)
Daily Session Note  Patient Details  Name: Samuel Holloway. MRN: [id number] Date of Birth: 10-26-1940 Referring Provider:   Flowsheet Row CARDIAC REHAB PHASE II ORIENTATION from 04/27/2022 in [place]  Referring Provider [name], FNP       Encounter Date: 05/01/2022  Check In:  Session Check In - 05/01/22 0930       Check-In   Supervising physician immediately available to respond to emergencies [facility] MD immediately available    Physician(s) Dr Dellia Cloud    Location [facility]    Staff Present Hoy Register MHA, MS, ACSM-CEP;Leana Roe, BS, Exercise Physiologist;Porfiria Heinrich Hassell Done, RN, BSN    Virtual Visit No    Medication changes reported     No    Fall or balance concerns reported    Yes    Comments He has had several falls this year. He reports that his legs give out. He walks with a cane.    Tobacco Cessation No Change    Warm-up and Cool-down Performed as group-led instruction    Resistance Training Performed Yes    VAD Patient? No    PAD/SET Patient? No      Pain Assessment   Currently in Pain? No/denies    Multiple Pain Sites No             Capillary Blood Glucose: Results for orders placed or performed during the hospital encounter of 04/27/22 (from the past 24 hour(s))  Glucose, capillary     Status: Abnormal   Collection Time: 05/01/22  9:18 AM  Result Value Ref Range   Glucose-Capillary 211 (H) 70 - 99 mg/dL      Social History   Tobacco Use  Smoking Status Not on file  Smokeless Tobacco Not on file    Goals Met:  Proper associated with RPD/PD & O2 Sat Exercise tolerated well No report of concerns or symptoms today Strength training completed today  Goals Unmet:  Not Applicable  Comments: Checkout at 1030.   Dr. Carlyle Dolly is Medical Director for [facility] Cardiac Re[facility]

## 2022-05-01 NOTE — Progress Notes (Signed)
Daily Session Note  Patient Details  Name: Samuel Holloway. MRN: [id number] Date of Birth: 1941/04/07 Referring Provider:   Flowsheet Row CARDIAC REHAB PHASE II ORIENTATION from 04/27/2022 in [place]  Referring Provider [name], FNP       Encounter Date: 05/01/2022  Check In:  Session Check In - 05/01/22 0930       Check-In   Supervising physician immediately available to respond to emergencies [facility] MD immediately available    Physician(s) Dr Dellia Cloud    Location [facility]    Staff Present Hoy Register MHA, MS, ACSM-CEP;Leana Roe, BS, Exercise Physiologist;Demika Langenderfer Hassell Done, RN, BSN    Virtual Visit No    Medication changes reported     No    Fall or balance concerns reported    Yes    Comments He has had several falls this year. He reports that his legs give out. He walks with a cane.    Tobacco Cessation No Change    Warm-up and Cool-down Performed as group-led instruction    Resistance Training Performed Yes    VAD Patient? No    PAD/SET Patient? No      Pain Assessment   Currently in Pain? No/denies    Multiple Pain Sites No             Capillary Blood Glucose: Results for orders placed or performed during the hospital encounter of 04/27/22 (from the past 24 hour(s))  Glucose, capillary     Status: Abnormal   Collection Time: 05/01/22  9:18 AM  Result Value Ref Range   Glucose-Capillary 211 (H) 70 - 99 mg/dL      Social History   Tobacco Use  Smoking Status Not on file  Smokeless Tobacco Not on file    Goals Met:  Independence with exercise equipment Exercise tolerated well No report of concerns or symptoms today Strength training completed today  Goals Unmet:  Not Applicable  Comments: Checkout at 1030.   Dr. Carlyle Dolly is Medical Director for [facility] Card[facility] Re[facility]

## 2022-05-03 ENCOUNTER — Encounter (HOSPITAL_COMMUNITY)
Admission: RE | Admit: 2022-05-03 | Discharge: 2022-05-03 | Disposition: A | Discharge status: Home / Self Care | Payer: No Typology Code available for payment source | Source: Ambulatory Visit | Attending: Nurse Practitioner | Admitting: Nurse Practitioner

## 2022-05-03 ENCOUNTER — Ambulatory Visit (HOSPITAL_COMMUNITY): Payer: No Typology Code available for payment source

## 2022-05-03 DIAGNOSIS — I2510 Atherosclerotic heart disease of native coronary artery without angina pectoris: Secondary | ICD-10-CM

## 2022-05-03 LAB — GLUCOSE, CAPILLARY: Glucose-Capillary: 246 mg/dL — ABNORMAL HIGH (ref 70–99)

## 2022-05-03 NOTE — Progress Notes (Signed)
Daily Session Note  Patient Details  Name: Samuel Holloway. MRN: [id number] Date of Birth: Nov 17, 1940 Referring Provider:   Flowsheet Row CARDIAC REHAB PHASE II ORIENTATION from 04/27/2022 in [place]  Referring Provider [name], FNP       Encounter Date: 05/03/2022  Check In:  Session Check In - 05/03/22 0929       Check-In   Supervising physician immediately available to respond to emergencies [facility] MD immediately available    Physician(s) Dr Dellia Cloud    Location [facility]    Staff Present Hoy Register MHA, MS, ACSM-CEP;[employer] BSN, RN;Heather Mel Almond, [place], Exercise Physiologist    Virtual Visit No    Medication changes reported     No    Fall or balance concerns reported    Yes    Comments He has had several falls this year. He reports that his legs give out. He walks with a cane.    Tobacco Cessation No Change    Warm-up and Cool-down Performed as group-led instruction    Resistance Training Performed Yes    VAD Patient? No    PAD/SET Patient? No      Pain Assessment   Currently in Pain? No/denies    Multiple Pain Sites No             Capillary Blood Glucose: No results found for this or any previous visit (from the past 24 hour(s)).    Social History   Tobacco Use  Smoking Status Not on file  Smokeless Tobacco Not on file    Goals Met:  Independence with exercise equipment Exercise tolerated well No report of concerns or symptoms today Strength training completed today  Goals Unmet:  Not Applicable  Comments: checkout time is 1030   Dr. Carlyle Dolly is Medical Director for [place]

## 2022-05-05 ENCOUNTER — Encounter (HOSPITAL_COMMUNITY): Payer: No Typology Code available for payment source

## 2022-05-05 ENCOUNTER — Ambulatory Visit (HOSPITAL_COMMUNITY): Payer: No Typology Code available for payment source

## 2022-05-08 ENCOUNTER — Ambulatory Visit (HOSPITAL_COMMUNITY): Payer: No Typology Code available for payment source

## 2022-05-08 ENCOUNTER — Encounter (HOSPITAL_COMMUNITY)
Admission: RE | Admit: 2022-05-08 | Discharge: 2022-05-08 | Disposition: A | Discharge status: Home / Self Care | Payer: No Typology Code available for payment source | Source: Ambulatory Visit | Attending: Nurse Practitioner | Admitting: Nurse Practitioner

## 2022-05-08 VITALS — Wt 190.3 lb

## 2022-05-08 DIAGNOSIS — I2510 Atherosclerotic heart disease of native coronary artery without angina pectoris: Secondary | ICD-10-CM

## 2022-05-08 LAB — GLUCOSE, CAPILLARY: Glucose-Capillary: 184 mg/dL — ABNORMAL HIGH (ref 70–99)

## 2022-05-08 NOTE — Progress Notes (Signed)
Daily Session Note  Patient Details  Name: Samuel Holloway. MRN: [id number] Date of Birth: [birth year] Referring Provider:   Flowsheet Row CARDIAC REHAB PHASE II ORIENTATION from 04/27/2022 in [place]  Referring Provider [name], FNP       Encounter Date: 05/08/2022  Check In:  Session Check In - 05/08/22 0925       Check-In   Supervising physician immediately available to respond to emergencies [facility] MD immediately available    Physician(s) Dr Dellia Cloud    Location [facility]    Staff Present Hoy Register MHA, MS, ACSM-CEP;Leana Roe, BS, Exercise Physiologist;Khristian Phillippi Hassell Done, RN, BSN    Virtual Visit No    Medication changes reported     Yes    Comments D/c lisinopril    Fall or balance concerns reported    Yes    Comments He has had several falls this year. He reports that his legs give out. He walks with a cane.    Tobacco Cessation No Change    Warm-up and Cool-down Performed as group-led instruction    Resistance Training Performed Yes    VAD Patient? No    PAD/SET Patient? No      Pain Assessment   Currently in Pain? No/denies    Multiple Pain Sites No             Capillary Blood Glucose: Results for orders placed or performed during the hospital encounter of 05/08/22 (from the past 24 hour(s))  Glucose, capillary     Status: Abnormal   Collection Time: 05/08/22  9:13 AM  Result Value Ref Range   Glucose-Capillary 184 (H) 70 - 99 mg/dL      Social History   Tobacco Use  Smoking Status Not on file  Smokeless Tobacco Not on file    Goals Met:  Independence with exercise equipment Exercise tolerated well No report of concerns or symptoms today Strength training completed today  Goals Unmet:  Not Applicable  Comments: Checkout at 1030.   Dr. Carlyle Dolly is Medical Director for [facility]

## 2022-05-10 ENCOUNTER — Ambulatory Visit (HOSPITAL_COMMUNITY): Payer: No Typology Code available for payment source

## 2022-05-10 ENCOUNTER — Encounter (HOSPITAL_COMMUNITY)
Admission: RE | Admit: 2022-05-10 | Discharge: 2022-05-10 | Disposition: A | Discharge status: Home / Self Care | Payer: No Typology Code available for payment source | Source: Ambulatory Visit | Attending: Nurse Practitioner | Admitting: Nurse Practitioner

## 2022-05-10 DIAGNOSIS — I2510 Atherosclerotic heart disease of native coronary artery without angina pectoris: Secondary | ICD-10-CM | POA: Diagnosis not present

## 2022-05-10 NOTE — Progress Notes (Signed)
Daily Session Note  Patient Details  Name: Samuel Holloway. MRN: [id number] Date of Birth: Nov 05, 1940 Referring Provider:   Flowsheet Row CARDIAC REHAB PHASE II ORIENTATION from 04/27/2022 in [place]  Referring Provider [name], FNP       Encounter Date: 05/10/2022  Check In:  Session Check In - 05/10/22 0930       Check-In   Supervising physician immediately available to respond to emergencies [facility] immediately available    Physician(s) Dr. Harl Bowie    Location [facility]    Staff Present Leana Roe, BS, Exercise Physiologist;Dalton Sherrie George, MS, ACSM-CEP;Melven Sartorius BSN, RN    Virtual Visit No    Medication changes reported     Yes    Comments D/c lisinopril    Fall or balance concerns reported    Yes    Comments He has had several falls this year. He reports that his legs give out. He walks with a cane.    Tobacco Cessation No Change    Warm-up and Cool-down Performed as group-led instruction    Resistance Training Performed Yes    VAD Patient? No    PAD/SET Patient? No      Pain Assessment   Currently in Pain? No/denies    Multiple Pain Sites No             Capillary Blood Glucose: No results found for this or any previous visit (from the past 24 hour(s)).    Social History   Tobacco Use  Smoking Status Not on file  Smokeless Tobacco Not on file    Goals Met:  Independence with exercise equipment Exercise tolerated well No report of concerns or symptoms today Strength training completed today  Goals Unmet:  Not Applicable  Comments: check out at 10:30   Dr. Carlyle Dolly is Medical Director for [place]

## 2022-05-12 ENCOUNTER — Ambulatory Visit (HOSPITAL_COMMUNITY): Payer: No Typology Code available for payment source

## 2022-05-12 ENCOUNTER — Encounter (HOSPITAL_COMMUNITY)
Admission: RE | Admit: 2022-05-12 | Discharge: 2022-05-12 | Disposition: A | Discharge status: Home / Self Care | Payer: No Typology Code available for payment source | Source: Ambulatory Visit | Attending: Nurse Practitioner | Admitting: Nurse Practitioner

## 2022-05-12 DIAGNOSIS — I2510 Atherosclerotic heart disease of native coronary artery without angina pectoris: Secondary | ICD-10-CM

## 2022-05-12 LAB — GLUCOSE, CAPILLARY: Glucose-Capillary: 239 mg/dL — ABNORMAL HIGH (ref 70–99)

## 2022-05-12 NOTE — Progress Notes (Signed)
Daily Session Note  Patient Details  Name: Samuel Holloway. MRN: [id number] Date of Birth: [birth year] Referring Provider:   Flowsheet Row CARDIAC REHAB PHASE II ORIENTATION from 04/27/2022 in [place]  Referring Provider [name], FNP       Encounter Date: 05/12/2022  Check In:  Session Check In - 05/12/22 0923       Check-In   Supervising physician immediately available to respond to emergencies [facility] immediately available    Physician(s) Dr. Harl Bowie    Location [facility]    Staff Present Leana Roe, BS, Exercise Physiologist;Dalton Sherrie George, MS, ACSM-CEP;Madelyn Flavors, RN, BSN    Virtual Visit No    Medication changes reported     Yes    Comments D/c lisinopril    Fall or balance concerns reported    Yes    Comments He has had several falls this year. He reports that his legs give out. He walks with a cane.    Warm-up and Cool-down Performed as group-led Higher education careers adviser Performed Yes    VAD Patient? No    PAD/SET Patient? No      Pain Assessment   Currently in Pain? No/denies    Multiple Pain Sites No             Capillary Blood Glucose: No results found for this or any previous visit (from the past 24 hour(s)).    Social History   Tobacco Use  Smoking Status Not on file  Smokeless Tobacco Not on file    Goals Met:  Independence with exercise equipment Exercise tolerated well No report of concerns or symptoms today Strength training completed today  Goals Unmet:  Not Applicable  Comments: checkout at 1030.   Dr. Carlyle Dolly is Medical Director for [facility]

## 2022-05-15 ENCOUNTER — Ambulatory Visit (HOSPITAL_COMMUNITY): Payer: No Typology Code available for payment source

## 2022-05-15 ENCOUNTER — Encounter (HOSPITAL_COMMUNITY)
Admission: RE | Admit: 2022-05-15 | Discharge: 2022-05-15 | Disposition: A | Discharge status: Home / Self Care | Payer: No Typology Code available for payment source | Source: Ambulatory Visit | Attending: Nurse Practitioner | Admitting: Nurse Practitioner

## 2022-05-15 DIAGNOSIS — I2510 Atherosclerotic heart disease of native coronary artery without angina pectoris: Secondary | ICD-10-CM

## 2022-05-15 NOTE — Progress Notes (Signed)
Daily Session Note  Patient Details  Name: Samuel Holloway. MRN: [id number] Date of Birth: [birth year] Referring Provider:   Flowsheet Row CARDIAC REHAB PHASE II ORIENTATION from 04/27/2022 in [place]  Referring Provider [name], FNP       Encounter Date: 05/15/2022  Check In:  Session Check In - 05/15/22 0931       Check-In   Supervising physician immediately available to respond to emergencies [facility] immediately available    Physician(s) Dr. Harl Bowie    Location [facility]    Staff Present Leana Roe, BS, Exercise Physiologist;Dalton Sherrie George, MS, ACSM-CEP;Madelyn Flavors, RN, BSN    Virtual Visit No    Medication changes reported     No    Comments D/c lisinopril    Fall or balance concerns reported    Yes    Comments He has had several falls this year. He reports that his legs give out. He walks with a cane.    Tobacco Cessation No Change    Warm-up and Cool-down Performed as group-led instruction    Resistance Training Performed Yes    VAD Patient? No    PAD/SET Patient? No      Pain Assessment   Currently in Pain? No/denies    Multiple Pain Sites No             Capillary Blood Glucose: No results found for this or any previous visit (from the past 24 hour(s)).    Social History   Tobacco Use  Smoking Status Not on file  Smokeless Tobacco Not on file    Goals Met:  Independence with exercise equipment Exercise tolerated well No report of concerns or symptoms today Strength training completed today  Goals Unmet:  Not Applicable  Comments: Checkout at 1030.   Dr. Carlyle Dolly is Medical Director for [facility]

## 2022-05-17 ENCOUNTER — Encounter (HOSPITAL_COMMUNITY)
Admission: RE | Admit: 2022-05-17 | Discharge: 2022-05-17 | Disposition: A | Discharge status: Home / Self Care | Payer: No Typology Code available for payment source | Source: Ambulatory Visit | Attending: Nurse Practitioner | Admitting: Nurse Practitioner

## 2022-05-17 ENCOUNTER — Ambulatory Visit (HOSPITAL_COMMUNITY): Payer: No Typology Code available for payment source

## 2022-05-17 DIAGNOSIS — I2510 Atherosclerotic heart disease of native coronary artery without angina pectoris: Secondary | ICD-10-CM | POA: Diagnosis not present

## 2022-05-17 NOTE — Progress Notes (Signed)
Daily Session Note  Patient Details  Name: Samuel Holloway. MRN: [id number] Date of Birth: 02/03/1941 Referring Provider:   Flowsheet Row CARDIAC REHAB PHASE II ORIENTATION from 04/27/2022 in [place]  Referring Provider [name], FNP       Encounter Date: 05/17/2022  Check In:  Session Check In - 05/17/22 0930       Check-In   Supervising physician immediately available to respond to emergencies [facility] immediately available    Physician(s) Dr. Domenic Polite    Location [facility]    Staff Present Hoy Register MHA, MS, ACSM-CEP;[employer] BSN, RN;Heather Mel Almond, [place], Exercise Physiologist    Virtual Visit No    Medication changes reported     No    Fall or balance concerns reported    Yes    Comments He has had several falls this year. He reports that his legs give out. He walks with a cane.    Tobacco Cessation No Change    Warm-up and Cool-down Performed as group-led instruction    Resistance Training Performed Yes    VAD Patient? No    PAD/SET Patient? No      Pain Assessment   Currently in Pain? No/denies    Multiple Pain Sites No             Capillary Blood Glucose: No results found for this or any previous visit (from the past 24 hour(s)).    Social History   Tobacco Use  Smoking Status Not on file  Smokeless Tobacco Not on file    Goals Met:  Independence with exercise equipment Exercise tolerated well No report of concerns or symptoms today Strength training completed today  Goals Unmet:  Not Applicable  Comments: checkout time is 1030   Dr. Carlyle Dolly is Medical Director for [place]

## 2022-05-19 ENCOUNTER — Ambulatory Visit (HOSPITAL_COMMUNITY): Payer: No Typology Code available for payment source

## 2022-05-19 ENCOUNTER — Encounter (HOSPITAL_COMMUNITY)
Admission: RE | Admit: 2022-05-19 | Discharge: 2022-05-19 | Disposition: A | Discharge status: Home / Self Care | Payer: No Typology Code available for payment source | Source: Ambulatory Visit | Attending: Nurse Practitioner | Admitting: Nurse Practitioner

## 2022-05-19 DIAGNOSIS — I2510 Atherosclerotic heart disease of native coronary artery without angina pectoris: Secondary | ICD-10-CM

## 2022-05-19 NOTE — Progress Notes (Addendum)
Daily Session Note  Patient Details  Name: Samuel Holloway. MRN: [id number] Date of Birth: [birth year] Referring Provider:   Flowsheet Row CARDIAC REHAB PHASE II ORIENTATION from 04/27/2022 in [place]  Referring Provider [name], FNP       Encounter Date: 05/19/2022  Check In:  Session Check In - 05/19/22 0917       Check-In   Supervising physician immediately available to respond to emergencies [facility] MD immediately available    Physician(s) Dr. Domenic Polite    Location [facility]    Staff Present Aundra Dubin, RN, BSN;Dalton Sherrie George, MS, ACSM-CEP;Leana Roe, BS, Exercise Physiologist    Virtual Visit No    Medication changes reported     No    Fall or balance concerns reported    Yes    Comments He has had several falls this year. He reports that his legs give out. He walks with a cane.    Tobacco Cessation No Change    Warm-up and Cool-down Performed as group-led instruction    VAD Patient? No    PAD/SET Patient? No      Pain Assessment   Currently in Pain? No/denies    Multiple Pain Sites No             Capillary Blood Glucose: No results found for this or any previous visit (from the past 24 hour(s)).    Social History   Tobacco Use  Smoking Status Not on file  Smokeless Tobacco Not on file    Goals Met:    Goals Unmet:    Comments: Patient did not complete his rehab session. His resting HR was >100 ranging between 114-107. He says he did take all of his medications and did not drink any caffeine this morning. His blood pressure was 108/62. He denied any chest pain, SOB or any other symptoms. We observed him through the warm-up and weights for 20 minutes. He was in NSR with occasional PVC's which has been his normal rhythm. Check out 0940   Dr. Carlyle Dolly is Medical Director for [facility]

## 2022-05-22 ENCOUNTER — Encounter (HOSPITAL_COMMUNITY)
Admission: RE | Admit: 2022-05-22 | Discharge: 2022-05-22 | Disposition: A | Discharge status: Home / Self Care | Payer: No Typology Code available for payment source | Source: Ambulatory Visit | Attending: Nurse Practitioner | Admitting: Nurse Practitioner

## 2022-05-22 ENCOUNTER — Ambulatory Visit (HOSPITAL_COMMUNITY): Payer: No Typology Code available for payment source

## 2022-05-22 DIAGNOSIS — I2510 Atherosclerotic heart disease of native coronary artery without angina pectoris: Secondary | ICD-10-CM

## 2022-05-22 NOTE — Progress Notes (Signed)
Incomplete Session Note  Patient Details  Name: Samuel Holloway. MRN: [id number] Date of Birth: Nov 19, 1940 Referring Provider:   Flowsheet Row CARDIAC REHAB PHASE II ORIENTATION from 04/27/2022 in [name]  Referring Provider [name], FNP       [name]. did not complete his rehab session.   Patient did not complete his rehab session. His resting HR was >100 ranging between 102-103. He says he did take all of his medications and did not drink any caffeine this morning. His blood pressure was 108/60. He denied any chest pain, SOB or any other symptoms. We observed him through the warm-up and weights for 20 minutes. He was in NSR with occasional PVC's which has been his normal rhythm. Check out 0940.

## 2022-05-24 ENCOUNTER — Encounter (HOSPITAL_COMMUNITY)
Admission: RE | Admit: 2022-05-24 | Discharge: 2022-05-24 | Disposition: A | Discharge status: Home / Self Care | Payer: No Typology Code available for payment source | Source: Ambulatory Visit | Attending: Nurse Practitioner | Admitting: Nurse Practitioner

## 2022-05-24 ENCOUNTER — Ambulatory Visit (HOSPITAL_COMMUNITY): Payer: No Typology Code available for payment source

## 2022-05-24 DIAGNOSIS — I2510 Atherosclerotic heart disease of native coronary artery without angina pectoris: Secondary | ICD-10-CM | POA: Diagnosis not present

## 2022-05-24 NOTE — Progress Notes (Signed)
Daily Session Note  Patient Details  Name: Samuel Holloway. MRN: [id number] Date of Birth: 05-16-1940 Referring Provider:   Flowsheet Row CARDIAC REHAB PHASE II ORIENTATION from 04/27/2022 in [place]  Referring Provider [name], FNP       Encounter Date: 05/24/2022  Check In:  Session Check In - 05/24/22 0926       Check-In   Supervising physician immediately available to respond to emergencies [facility] MD immediately available    Physician(s) Dr. Dellia Cloud    Location [facility]    Staff Present Hoy Register MHA, MS, ACSM-CEP;Leana Roe, BS, Exercise Physiologist;Daphyne Hassell Done, RN, BSN    Virtual Visit No    Medication changes reported     No    Fall or balance concerns reported    Yes    Comments He has had several falls this year. He reports that his legs give out. He walks with a cane.    Tobacco Cessation No Change    Warm-up and Cool-down Performed as group-led instruction    Resistance Training Performed Yes    VAD Patient? No    PAD/SET Patient? No      Pain Assessment   Currently in Pain? No/denies    Multiple Pain Sites No             Capillary Blood Glucose: No results found for this or any previous visit (from the past 24 hour(s)).    Social History   Tobacco Use  Smoking Status Not on file  Smokeless Tobacco Not on file    Goals Met:  Independence with exercise equipment Exercise tolerated well No report of concerns or symptoms today Strength training completed today  Goals Unmet:  Not Applicable  Comments: checkout time is 1030   Dr. Carlyle Dolly is Medical Director for [place]

## 2022-05-24 NOTE — Progress Notes (Signed)
Cardiac Individual Treatment Plan  Patient Details  Name: Samuel Holloway. MRN: [id number] Date of Birth: 10/07/1940 Referring Provider:   Flowsheet Row CARDIAC REHAB PHASE II ORIENTATION from 04/27/2022 in [place]  Referring Provider [name], FNP       Initial Encounter Date:  Flowsheet Row CARDIAC REHAB PHASE II ORIENTATION from 04/27/2022 in [place]  Date 04/27/22       Visit Diagnosis: Atherosclerosis of native coronary artery of native heart without angina pectoris  Patient's Home Medications on Admission:  Current Outpatient Medications:    aspirin EC 81 MG tablet, Take 81 mg by mouth daily., Disp: , Rfl:    atorvastatin (LIPITOR) 80 MG tablet, Take 40 mg by mouth daily., Disp: , Rfl:    BRILINTA 90 MG TABS tablet, Take 90 mg by mouth 2 times daily at 12 noon and 4 pm., Disp: , Rfl:    Calcium Carb-Cholecalciferol 600-10 MG-MCG TABS, Take 1 tablet by mouth 2 (two) times daily., Disp: , Rfl:    carboxymethylcellulose (REFRESH PLUS) 0.5 % SOLN, Place 1 drop into both eyes at bedtime., Disp: , Rfl:    cholecalciferol (VITAMIN D3) 25 MCG (1000 UNIT) tablet, Take 1,000 Units by mouth daily., Disp: , Rfl:    diclofenac (VOLTAREN) 75 MG EC tablet, Take 75 mg by mouth 2 (two) times daily as needed for mild pain., Disp: , Rfl:    fluticasone (FLONASE) 50 MCG/ACT nasal spray, Place 1 spray into both nostrils daily., Disp: , Rfl:    hydrochlorothiazide (HYDRODIURIL) 25 MG tablet, Take 12.5 mg by mouth daily., Disp: , Rfl:    isosorbide mononitrate (IMDUR) 30 MG 24 hr tablet, Take 30 mg by mouth daily., Disp: , Rfl:    levothyroxine (SYNTHROID) 200 MCG tablet, Take 200 mcg by mouth daily before breakfast., Disp: , Rfl:    LYCOPENE PO, Take 1 tablet by mouth daily., Disp: , Rfl:    metFORMIN (GLUCOPHAGE) 1000 MG tablet, Take 1,000 mg by mouth 2 (two) times daily with a meal., Disp: , Rfl:    metoprolol succinate (TOPROL-XL) 100  MG 24 hr tablet, Take 50 mg by mouth daily., Disp: , Rfl:    omeprazole (PRILOSEC) 40 MG capsule, Take 40 mg by mouth daily., Disp: , Rfl:    oxybutynin (DITROPAN) 5 MG tablet, Take 5 mg by mouth 2 (two) times daily., Disp: , Rfl:    polyethylene glycol powder (GLYCOLAX/MIRALAX) 17 GM/SCOOP powder, Take 17 g by mouth daily as needed for mild constipation., Disp: , Rfl:    senna-docusate (SENOKOT-S) 8.6-50 MG tablet, Take 2 tablets by mouth at bedtime as needed for mild constipation., Disp: , Rfl:    sertraline (ZOLOFT) 100 MG tablet, Take 100 mg by mouth daily., Disp: , Rfl:    sodium chloride (OCEAN) 0.65 % nasal spray, Place 1 spray into the nose every 2 (two) hours., Disp: , Rfl:    Timolol Maleate, Once-Daily, 0.5 % SOLN, Apply 1 drop to eye daily., Disp: , Rfl:   Past Medical History: No past medical history on file.  Tobacco Use: Social History   Tobacco Use  Smoking Status Not on file  Smokeless Tobacco Not on file    Labs: Review Flowsheet       Latest Ref Rng & Units 01/23/2005 08/05/2008  Labs for ITP Cardiac and Pulmonary Rehab  Cholestrol 0 - 200 mg/dL - 249   LDL (calc) 0 - 99 mg/dL 109  141   HDL-C >39  mg/dL - 35   Trlycerides <150 mg/dL - 363   Hemoglobin A1c % - 6.4     Capillary Blood Glucose: Lab Results  Component Value Date   GLUCAP 239 (H) 05/12/2022   GLUCAP 184 (H) 05/08/2022   GLUCAP 246 (H) 05/03/2022   GLUCAP 211 (H) 05/01/2022   GLUCAP 175 (H) 04/27/2022    POCT Glucose     Row Name 04/27/22 1024             POCT Blood Glucose   Pre-Exercise 175 mg/dL                Exercise Target Goals: Exercise Program Goal: Individual exercise prescription set using results from initial 6 min walk test and THRR while considering  patient's activity barriers and safety.   Exercise Prescription Goal: Starting with aerobic activity 30 plus minutes a day, 3 days per week for initial exercise prescription. Provide home exercise prescription and  guidelines that participant acknowledges understanding prior to discharge.  Activity Barriers & Risk Stratification:  Activity Barriers & Cardiac Risk Stratification - 04/27/22 0838       Activity Barriers & Cardiac Risk Stratification   Activity Barriers Arthritis;Back Problems;Neck/Spine Problems;Joint Problems;Deconditioning;Muscular Weakness;Balance Concerns;History of Falls;Assistive Device    Cardiac Risk Stratification High             6 Minute Walk:  6 Minute Walk     Row Name 04/27/22 1020         6 Minute Walk   Phase Initial     Distance 1050 feet     Walk Time 6 minutes     # of Rest Breaks 0     MPH 1.99     METS 1.84     RPE 9     VO2 Peak 6.45     Symptoms No     Resting HR 73 bpm     Resting BP 100/52     Resting Oxygen Saturation  99 %     Exercise Oxygen Saturation  during 6 min walk 99 %     Max Ex. HR 86 bpm     Max Ex. BP 122/60     2 Minute Post BP 102/60              Oxygen Initial Assessment:   Oxygen Re-Evaluation:   Oxygen Discharge (Final Oxygen Re-Evaluation):   Initial Exercise Prescription:  Initial Exercise Prescription - 04/27/22 1000       Date of Initial Exercise RX and Referring Provider   Date 04/27/22    Referring Provider [name], FNP    Expected Discharge Date 07/21/22      NuStep   Level 1    SPM 80    Minutes 22      Arm Ergometer   Level 1    RPM 60    Minutes 17      Prescription Details   Frequency (times per week) 3    Duration Progress to 30 minutes of continuous aerobic without signs/symptoms of physical distress      Intensity   THRR 40-80% of Max Heartrate 56-112    Ratings of Perceived Exertion 11-13    Perceived Dyspnea 0-4      Resistance Training   Training Prescription Yes    Weight 4    Reps 10-15             Perform Capillary Blood Glucose checks as needed.  Exercise Prescription Changes:  Exercise Prescription Changes     Row Name 05/08/22 1200  05/17/22 1428           Response to Exercise   Blood Pressure (Admit) 96/62 102/68      Blood Pressure (Exercise) 138/70 128/64      Blood Pressure (Exit) 120/60 94/60      Heart Rate (Admit) 76 bpm 86 bpm      Heart Rate (Exercise) 107 bpm 108 bpm      Heart Rate (Exit) 85 bpm 95 bpm      Rating of Perceived Exertion (Exercise) 13 13      Duration Continue with 30 min of aerobic exercise without signs/symptoms of physical distress. Continue with 30 min of aerobic exercise without signs/symptoms of physical distress.      Intensity THRR unchanged THRR unchanged        Progression   Progression Continue to progress workloads to maintain intensity without signs/symptoms of physical distress. Continue to progress workloads to maintain intensity without signs/symptoms of physical distress.        Resistance Training   Training Prescription Yes Yes      Weight 3 4      Reps 10-15 10-15      Time 10 Minutes 10 Minutes        NuStep   Level 2 2      SPM 98 118      Minutes 22 22      METs 2.59 3.27        Arm Ergometer   Level 2 2      RPM 46 50      Minutes 17 17      METs 1.97 2.1               Exercise Comments:   Exercise Goals and Review:   Exercise Goals     Row Name 04/27/22 1023 05/22/22 1429           Exercise Goals   Increase Physical Activity Yes Yes      Intervention Provide advice, education, support and counseling about physical activity/exercise needs.;Develop an individualized exercise prescription for aerobic and resistive training based on initial evaluation findings, risk stratification, comorbidities and participant's personal goals. Provide advice, education, support and counseling about physical activity/exercise needs.;Develop an individualized exercise prescription for aerobic and resistive training based on initial evaluation findings, risk stratification, comorbidities and participant's personal goals.      Expected Outcomes Short Term:  Attend rehab on a regular basis to increase amount of physical activity.;Long Term: Add in home exercise to make exercise part of routine and to increase amount of physical activity.;Long Term: Exercising regularly at least 3-5 days a week. Short Term: Attend rehab on a regular basis to increase amount of physical activity.;Long Term: Add in home exercise to make exercise part of routine and to increase amount of physical activity.;Long Term: Exercising regularly at least 3-5 days a week.      Increase Strength and Stamina Yes Yes      Intervention Provide advice, education, support and counseling about physical activity/exercise needs.;Develop an individualized exercise prescription for aerobic and resistive training based on initial evaluation findings, risk stratification, comorbidities and participant's personal goals. Provide advice, education, support and counseling about physical activity/exercise needs.;Develop an individualized exercise prescription for aerobic and resistive training based on initial evaluation findings, risk stratification, comorbidities and participant's personal goals.      Expected Outcomes Short Term: Increase workloads from initial exercise prescription  for resistance, speed, and METs.;Short Term: Perform resistance training exercises routinely during rehab and add in resistance training at home;Long Term: Improve cardiorespiratory fitness, muscular endurance and strength as measured by increased METs and functional capacity ( ) Short Term: Increase workloads from initial exercise prescription for resistance, speed, and METs.;Short Term: Perform resistance training exercises routinely during rehab and add in resistance training at home;Long Term: Improve cardiorespiratory fitness, muscular endurance and strength as measured by increased METs and functional capacity ( )      Able to understand and use rate of perceived exertion (RPE) scale Yes Yes      Intervention Provide  education and explanation on how to use RPE scale Provide education and explanation on how to use RPE scale      Expected Outcomes Short Term: Able to use RPE daily in rehab to express subjective intensity level;Long Term:  Able to use RPE to guide intensity level when exercising independently Short Term: Able to use RPE daily in rehab to express subjective intensity level;Long Term:  Able to use RPE to guide intensity level when exercising independently      Knowledge and understanding of Target Heart Rate Range (THRR) Yes Yes      Intervention Provide education and explanation of THRR including how the numbers were predicted and where they are located for reference Provide education and explanation of THRR including how the numbers were predicted and where they are located for reference      Expected Outcomes Short Term: Able to state/look up THRR;Short Term: Able to use daily as guideline for intensity in rehab;Long Term: Able to use THRR to govern intensity when exercising independently Short Term: Able to state/look up THRR;Short Term: Able to use daily as guideline for intensity in rehab;Long Term: Able to use THRR to govern intensity when exercising independently      Able to check pulse independently Yes Yes      Intervention Provide education and demonstration on how to check pulse in carotid and radial arteries.;Review the importance of being able to check your own pulse for safety during independent exercise Provide education and demonstration on how to check pulse in carotid and radial arteries.;Review the importance of being able to check your own pulse for safety during independent exercise      Expected Outcomes Short Term: Able to explain why pulse checking is important during independent exercise;Long Term: Able to check pulse independently and accurately Short Term: Able to explain why pulse checking is important during independent exercise;Long Term: Able to check pulse independently and  accurately      Understanding of Exercise Prescription Yes Yes      Intervention Provide education, explanation, and written materials on patient's individual exercise prescription Provide education, explanation, and written materials on patient's individual exercise prescription      Expected Outcomes Short Term: Able to explain program exercise prescription;Long Term: Able to explain home exercise prescription to exercise independently Short Term: Able to explain program exercise prescription;Long Term: Able to explain home exercise prescription to exercise independently               Exercise Goals Re-Evaluation :  Exercise Goals Re-Evaluation     Row Name 05/22/22 1429             Exercise Goal Re-Evaluation   Exercise Goals Review Increase Strength and Stamina;Increase Physical Activity;Able to understand and use rate of perceived exertion (RPE) scale;Knowledge and understanding of Target Heart Rate Range (THRR);Able to check pulse independently;Understanding of  Exercise Prescription       Comments Pt has completed 8 sessions of CR. He is egar to come to class and exercise. The last two sessions his HR has been over 100 bpm at rest (114bpm & 108bpm) causing him to not to be able to exercise. He was currently exercising at 3.27 METs on the stepper. Will continue to monitor and progress as able.       Expected Outcomes Through exercise at rehab and home, the patient will meet theirn stated goals.                 Discharge Exercise Prescription (Final Exercise Prescription Changes):  Exercise Prescription Changes - 05/17/22 1428       Response to Exercise   Blood Pressure (Admit) 102/68    Blood Pressure (Exercise) 128/64    Blood Pressure (Exit) 94/60    Heart Rate (Admit) 86 bpm    Heart Rate (Exercise) 108 bpm    Heart Rate (Exit) 95 bpm    Rating of Perceived Exertion (Exercise) 13    Duration Continue with 30 min of aerobic exercise without signs/symptoms of  physical distress.    Intensity THRR unchanged      Progression   Progression Continue to progress workloads to maintain intensity without signs/symptoms of physical distress.      Resistance Training   Training Prescription Yes    Weight 4    Reps 10-15    Time 10 Minutes      NuStep   Level 2    SPM 118    Minutes 22    METs 3.27      Arm Ergometer   Level 2    RPM 50    Minutes 17    METs 2.1             Nutrition:  Target Goals: Understanding of nutrition guidelines, daily intake of sodium 1500mg , cholesterol 200mg , calories 30% from fat and 7% or less from saturated fats, daily to have 5 or more servings of fruits and vegetables.  Biometrics:  Pre Biometrics - 04/27/22 1023       Pre Biometrics   Height 5\' 11"  (1.803 m)    Weight 86.9 kg    Waist Circumference 40.5 inches    Hip Circumference 43 inches    Waist to Hip Ratio 0.94 %    BMI (Calculated) 26.73    Triceps Skinfold 23 mm    % Body Fat 29.3 %    Grip Strength 23.1 kg    Flexibility 0 in    Single Leg Stand 0 seconds              Nutrition Therapy Plan and Nutrition Goals:  Nutrition Therapy & Goals - 05/15/22 1029       Personal Nutrition Goals   Comments Patient's diet assessment score was 51. We offer 2 educational sessions on heart healthy nutrition with handouts and assistance with RD referral if patient is interested.      Intervention Plan   Intervention Nutrition handout(s) given to patient.    Expected Outcomes Short Term Goal: Understand basic principles of dietary content, such as calories, fat, sodium, cholesterol and nutrients.             Nutrition Assessments:  Nutrition Assessments - 04/27/22 0850       MEDFICTS Scores   Pre Score 51            MEDIFICTS Score Key: ?70 Need to make  dietary changes  40-70 Heart Healthy Diet ? 40 Therapeutic Level Cholesterol Diet   Picture Your Plate Scores: <24 Unhealthy dietary pattern with much room for  improvement. 41-50 Dietary pattern unlikely to meet recommendations for good health and room for improvement. 51-60 More healthful dietary pattern, with some room for improvement.  >60 Healthy dietary pattern, although there may be some specific behaviors that could be improved.    Nutrition Goals Re-Evaluation:   Nutrition Goals Discharge (Final Nutrition Goals Re-Evaluation):   Psychosocial: Target Goals: Acknowledge presence or absence of significant depression and/or stress, maximize coping skills, provide positive support system. Participant is able to verbalize types and ability to use techniques and skills needed for reducing stress and depression.  Initial Review & Psychosocial Screening:  Initial Psych Review & Screening - 04/27/22 0840       Initial Review   Current issues with None Identified      Family Dynamics   Good Support System? Yes    Comments His daughter is his support system.      Barriers   Psychosocial barriers to participate in program There are no identifiable barriers or psychosocial needs.      Screening Interventions   Interventions Encouraged to exercise    Expected Outcomes Long Term goal: The participant improves quality of Life and PHQ9 Scores as seen by post scores and/or verbalization of changes;Short Term goal: Identification and review with participant of any Quality of Life or Depression concerns found by scoring the questionnaire.             Quality of Life Scores:  Quality of Life - 04/27/22 1020       Quality of Life   Select Quality of Life      Quality of Life Scores   Health/Function Pre 22.15 %    Socioeconomic Pre 22.5 %    Psych/Spiritual Pre 27.43 %    Family Pre 19.2 %    GLOBAL Pre 22.97 %            Scores of 19 and below usually indicate a poorer quality of life in these areas.  A difference of  2-3 points is a clinically meaningful difference.  A difference of 2-3 points in the total score of the Quality  of Life Index has been associated with significant improvement in overall quality of life, self-image, physical symptoms, and general health in studies assessing change in quality of life.  PHQ-9: Review Flowsheet       04/27/2022  Depression screen PHQ 2/9  Decreased Interest 0  Down, Depressed, Hopeless 0  PHQ - 2 Score 0  Altered sleeping 0  Tired, decreased energy 1  Change in appetite 1  Feeling bad or failure about yourself  0  Trouble concentrating 0  Moving slowly or fidgety/restless 0  Suicidal thoughts 0  PHQ-9 Score 2  Difficult doing work/chores Somewhat difficult   Interpretation of Total Score  Total Score Depression Severity:  1-4 = Minimal depression, 5-9 = Mild depression, 10-14 = Moderate depression, 15-19 = Moderately severe depression, 20-27 = Severe depression   Psychosocial Evaluation and Intervention:  Psychosocial Evaluation - 04/27/22 1506       Psychosocial Evaluation & Interventions   Interventions Encouraged to exercise with the program and follow exercise prescription;Relaxation education;Stress management education    Comments Pt has no barriers to participating in CR. He has no identifiable psychosocial issues. He scored a 2 on his PHQ-9, and this relates to his poor appetite  and his lack of energy. He lives by himself and he frequently goes to [place] to eat out, and due to this he eats a lot of fried foods. He lists his supports system as his daughter. He has several children and grand children who live in [place], but he only routinely sees one of his daughters. He reports that his goals while in the program are to increase his strength and stamina. He was excited when he was fitted on the machines. He states that he used to exercise and go to the gym a lot when his was in the [employer] in his younger years.    Expected Outcomes Pt will continue to have no identifiable psychosocial issues.    Continue Psychosocial Services  No Follow up required              Psychosocial Re-Evaluation:  Psychosocial Re-Evaluation     Row Name 05/17/22 [phone]             Psychosocial Re-Evaluation   Current issues with None Identified       Comments Patient is new to the program completing 6 sessisons. He is doing well in the program. He is on Zoloft to manage depression. He continues to have no psychosocial barriers identified. We will continue to monitor his progress.       Expected Outcomes Patient will complete the program meeting both personal and program goals.       Interventions Encouraged to attend Cardiac Rehabilitation for the exercise;Relaxation education;Stress management education       Continue Psychosocial Services  No Follow up required                Psychosocial Discharge (Final Psychosocial Re-Evaluation):  Psychosocial Re-Evaluation - 05/17/22 0109       Psychosocial Re-Evaluation   Current issues with None Identified    Comments Patient is new to the program completing 6 sessisons. He is doing well in the program. He is on Zoloft to manage depression. He continues to have no psychosocial barriers identified. We will continue to monitor his progress.    Expected Outcomes Patient will complete the program meeting both personal and program goals.    Interventions Encouraged to attend Cardiac Rehabilitation for the exercise;Relaxation education;Stress management education    Continue Psychosocial Services  No Follow up required             Vocational Rehabilitation: Provide vocational rehab assistance to qualifying candidates.   Vocational Rehab Evaluation & Intervention:  Vocational Rehab - 04/27/22 0854       Initial Vocational Rehab Evaluation & Intervention   Assessment shows need for Vocational Rehabilitation No      Vocational Rehab Re-Evaulation   Comments retired             Education: Education Goals: Education classes will be provided on a weekly basis, covering required topics. Participant  will state understanding/return demonstration of topics presented.  Learning Barriers/Preferences:  Learning Barriers/Preferences - 04/27/22 0850       Learning Barriers/Preferences   Learning Barriers Sight    Learning Preferences Audio;Group Instruction;Individual Instruction;Skilled Demonstration;Pictoral;Verbal Instruction;Written Material             Education Topics: Hypertension, Hypertension Reduction -Define heart disease and high blood pressure. Discus how high blood pressure affects the body and ways to reduce high blood pressure.   Exercise and Your Heart -Discuss why it is important to exercise, the FITT principles of exercise, normal and abnormal responses to exercise, and  how to exercise safely. Flowsheet Row CARDIAC REHAB PHASE II EXERCISE from 05/17/2022 in [facility]  Date 05/03/22  Educator DF  Instruction Review Code 2- Demonstrated Understanding       Angina -Discuss definition of angina, causes of angina, treatment of angina, and how to decrease risk of having angina. Flowsheet Row CARDIAC REHAB PHASE II EXERCISE from 05/17/2022 in [place]  Date 05/10/22  Educator DF  Instruction Review Code 1- Verbalizes Understanding       Cardiac Medications -Review what the following cardiac medications are used for, how they affect the body, and side effects that may occur when taking the medications.  Medications include Aspirin, Beta blockers, calcium channel blockers, ACE Inhibitors, angiotensin receptor blockers, diuretics, digoxin, and antihyperlipidemics.   Congestive Heart Failure -Discuss the definition of CHF, how to live with CHF, the signs and symptoms of CHF, and how keep track of weight and sodium intake. Flowsheet Row CARDIAC REHAB PHASE II EXERCISE from 05/17/2022 in [place]  Date 05/17/22  Educator DF  Instruction Review Code 2- Demonstrated Understanding        Heart Disease and Intimacy -Discus the effect sexual activity has on the heart, how changes occur during intimacy as we age, and safety during sexual activity.   Smoking Cessation / COPD -Discuss different methods to quit smoking, the health benefits of quitting smoking, and the definition of COPD.   Nutrition I: Fats -Discuss the types of cholesterol, what cholesterol does to the heart, and how cholesterol levels can be controlled.   Nutrition II: Labels -Discuss the different components of food labels and how to read food label   Heart Parts/Heart Disease and PAD -Discuss the anatomy of the heart, the pathway of blood circulation through the heart, and these are affected by heart disease.   Stress I: Signs and Symptoms -Discuss the causes of stress, how stress may lead to anxiety and depression, and ways to limit stress.   Stress II: Relaxation -Discuss different types of relaxation techniques to limit stress.   Warning Signs of Stroke / TIA -Discuss definition of a stroke, what the signs and symptoms are of a stroke, and how to identify when someone is having stroke.   Knowledge Questionnaire Score:  Knowledge Questionnaire Score - 04/27/22 0851       Knowledge Questionnaire Score   Pre Score 18/24             Core Components/Risk Factors/Patient Goals at Admission:  Personal Goals and Risk Factors at Admission - 04/27/22 0855       Core Components/Risk Factors/Patient Goals on Admission   Diabetes Yes    Intervention Provide education about signs/symptoms and action to take for hypo/hyperglycemia.;Provide education about proper nutrition, including hydration, and aerobic/resistive exercise prescription along with prescribed medications to achieve blood glucose in normal ranges: Fasting glucose 65-99 mg/dL    Expected Outcomes Short Term: Participant verbalizes understanding of the signs/symptoms and immediate care of hyper/hypoglycemia, proper foot care  and importance of medication, aerobic/resistive exercise and nutrition plan for blood glucose control.;Long Term: Attainment of HbA1C < 7%.    Hypertension Yes    Intervention Provide education on lifestyle modifcations including regular physical activity/exercise, weight management, moderate sodium restriction and increased consumption of fresh fruit, vegetables, and low fat dairy, alcohol moderation, and smoking cessation.;Monitor prescription use compliance.    Expected Outcomes Short Term: Continued assessment and intervention until BP is < 140/32mm HG in hypertensive participants. < 130/52mm  HG in hypertensive participants with diabetes, heart failure or chronic kidney disease.;Long Term: Maintenance of blood pressure at goal levels.    Lipids Yes    Intervention Provide education and support for participant on nutrition & aerobic/resistive exercise along with prescribed medications to achieve LDL 70mg , HDL >40mg .    Expected Outcomes Short Term: Participant states understanding of desired cholesterol values and is compliant with medications prescribed. Participant is following exercise prescription and nutrition guidelines.;Long Term: Cholesterol controlled with medications as prescribed, with individualized exercise RX and with personalized nutrition plan. Value goals: LDL < 70mg , HDL > 40 mg.    Personal Goal Other Yes    Personal Goal Increase Strength and stamina    Intervention Attend CR and exercise at home at least two days per week    Expected Outcomes Pt will meet stated goals.             Core Components/Risk Factors/Patient Goals Review:   Goals and Risk Factor Review     Row Name 05/17/22 0940             Core Components/Risk Factors/Patient Goals Review   Personal Goals Review Diabetes;Lipids;Hypertension;Other       Review Patient was referred to CR with Atherosclerotic heart disease of native coronary artery without angina from the [facility]. He has multiple risk factors  for CAD and is participating in the program for risk modification. He has completed 6 sessions. His current weight is 190.5 lbs down 0.5 lbs from his initial visit. He is doing well in the program with consistent attendance and progressions. His DM is managed with Metformin and Jardiance. His last A1C on file was 04/06/22 at 9.0% up from July at 7.2%. [facility] and he discontinued HCTZ for complaints of dizziness. His blood pressures are well controlled. His personal goals for the program are to increase his strength and stamina. We will continue to monitor his progress as he works towards meeting these goals.       Expected Outcomes Patient will complete the program meeting both personal and program goals.                Core Components/Risk Factors/Patient Goals at Discharge (Final Review):   Goals and Risk Factor Review - 05/17/22 0940       Core Components/Risk Factors/Patient Goals Review   Personal Goals Review Diabetes;Lipids;Hypertension;Other    Review Patient was referred to CR with Atherosclerotic heart disease of native coronary artery without angina from the [facility]. He has multiple risk factors for CAD and is participating in the program for risk modification. He has completed 6 sessions. His current weight is 190.5 lbs down 0.5 lbs from his initial visit. He is doing well in the program with consistent attendance and progressions. His DM is managed with Metformin and Jardiance. His last A1C on file was 04/06/22 at 9.0% up from July at 7.2%. [facility] and he discontinued HCTZ for complaints of dizziness. His blood pressures are well controlled. His personal goals for the program are to increase his strength and stamina. We will continue to monitor his progress as he works towards meeting these goals.    Expected Outcomes Patient will complete the program meeting both personal and program goals.             ITP Comments:  ITP Comments     Row Name  05/03/22 0949           ITP  Comments Pt's heart rate was 10-15 beats above his target today with light effort. When asked, pt admits that he did not take his medications this morning. I instructed the pt to always take his cardiac medications before attending rehab. He voiced understanding.                Comments: ITP REVIEW Pt is making expected progress toward Cardiac Rehab goals after completing 10 sessions. Recommend continued exercise, life style modification, education, and increased stamina and strength.

## 2022-05-26 ENCOUNTER — Encounter (HOSPITAL_COMMUNITY)
Admission: RE | Admit: 2022-05-26 | Discharge: 2022-05-26 | Disposition: A | Discharge status: Home / Self Care | Payer: No Typology Code available for payment source | Source: Ambulatory Visit | Attending: Nurse Practitioner | Admitting: Nurse Practitioner

## 2022-05-26 ENCOUNTER — Ambulatory Visit (HOSPITAL_COMMUNITY): Payer: No Typology Code available for payment source

## 2022-05-26 DIAGNOSIS — I2510 Atherosclerotic heart disease of native coronary artery without angina pectoris: Secondary | ICD-10-CM | POA: Diagnosis present

## 2022-05-26 NOTE — Progress Notes (Signed)
Daily Session Note  Patient Details  Name: Samuel Holloway. MRN: [id number] Date of Birth: 1941/04/06 Referring Provider:   Flowsheet Row CARDIAC REHAB PHASE II ORIENTATION from 04/27/2022 in [place]  Referring Provider [name], FNP       Encounter Date: 05/26/2022  Check In:  Session Check In - 05/26/22 8144       Check-In   Supervising physician immediately available to respond to emergencies [facility] MD immediately available    Physician(s) Dr. Dellia Cloud    Location [facility]    Staff Present Hoy Register MHA, MS, ACSM-CEP;Madelyn Flavors, RN, Jennye Moccasin, RN, BSN    Virtual Visit No    Medication changes reported     No    Comments D/c lisinopril    Fall or balance concerns reported    Yes    Comments He has had several falls this year. He reports that his legs give out. He walks with a cane.    Tobacco Cessation No Change    Warm-up and Cool-down Performed as group-led instruction    Resistance Training Performed Yes    VAD Patient? No    PAD/SET Patient? No      Pain Assessment   Currently in Pain? No/denies    Multiple Pain Sites No             Capillary Blood Glucose: No results found for this or any previous visit (from the past 24 hour(s)).    Social History   Tobacco Use  Smoking Status Not on file  Smokeless Tobacco Not on file    Goals Met:  Independence with exercise equipment Exercise tolerated well No report of concerns or symptoms today Strength training completed today  Goals Unmet:  Not Applicable  Comments: Checkout at 1030.   Dr. Carlyle Dolly is Medical Director for [facility] Cardiac Rehab

## 2022-05-29 ENCOUNTER — Encounter (HOSPITAL_COMMUNITY)
Admission: RE | Admit: 2022-05-29 | Discharge: 2022-05-29 | Disposition: A | Discharge status: Home / Self Care | Payer: No Typology Code available for payment source | Source: Ambulatory Visit | Attending: Nurse Practitioner | Admitting: Nurse Practitioner

## 2022-05-29 ENCOUNTER — Ambulatory Visit (HOSPITAL_COMMUNITY): Payer: No Typology Code available for payment source

## 2022-05-29 DIAGNOSIS — I2510 Atherosclerotic heart disease of native coronary artery without angina pectoris: Secondary | ICD-10-CM

## 2022-05-29 LAB — GLUCOSE, CAPILLARY: Glucose-Capillary: 252 mg/dL — ABNORMAL HIGH (ref 70–99)

## 2022-05-29 NOTE — Progress Notes (Signed)
Daily Session Note  Patient Details  Name: Samuel Holloway. MRN: [id number] Date of Birth: 1940/09/28 Referring Provider:   Flowsheet Row CARDIAC REHAB PHASE II ORIENTATION from 04/27/2022 in [place]  Referring Provider [name], FNP       Encounter Date: 05/29/2022  Check In:  Session Check In - 05/29/22 0929       Check-In   Supervising physician immediately available to respond to emergencies [facility] MD immediately available    Physician(s) Dr Harl Bowie    Location [facility]    Staff Present Hoy Register MHA, MS, ACSM-CEP;Madelyn Flavors, RN, Jennye Moccasin, RN, BSN    Virtual Visit No    Medication changes reported     No    Comments D/c lisinopril    Fall or balance concerns reported    Yes    Comments He has had several falls this year. He reports that his legs give out. He walks with a cane.    Tobacco Cessation No Change    Warm-up and Cool-down Performed as group-led instruction    Resistance Training Performed Yes    VAD Patient? No    PAD/SET Patient? No      Pain Assessment   Currently in Pain? No/denies    Multiple Pain Sites No             Capillary Blood Glucose: Results for orders placed or performed during the hospital encounter of 05/29/22 (from the past 24 hour(s))  Glucose, capillary     Status: Abnormal   Collection Time: 05/29/22  9:26 AM  Result Value Ref Range   Glucose-Capillary 252 (H) 70 - 99 mg/dL      Social History   Tobacco Use  Smoking Status Not on file  Smokeless Tobacco Not on file    Goals Met:  Independence with exercise equipment Exercise tolerated well No report of concerns or symptoms today Strength training completed today  Goals Unmet:  Not Applicable  Comments: Checkout at 1030.   Dr. Carlyle Dolly is Medical Director for [facility] Cardiac Rehab

## 2022-05-31 ENCOUNTER — Ambulatory Visit (HOSPITAL_COMMUNITY): Payer: No Typology Code available for payment source

## 2022-05-31 ENCOUNTER — Encounter (HOSPITAL_COMMUNITY)
Admission: RE | Admit: 2022-05-31 | Discharge: 2022-05-31 | Disposition: A | Discharge status: Home / Self Care | Payer: No Typology Code available for payment source | Source: Ambulatory Visit | Attending: Nurse Practitioner | Admitting: Nurse Practitioner

## 2022-05-31 DIAGNOSIS — I2510 Atherosclerotic heart disease of native coronary artery without angina pectoris: Secondary | ICD-10-CM

## 2022-05-31 NOTE — Progress Notes (Signed)
Daily Session Note  Patient Details  Name: Samuel Holloway. MRN: [id number] Date of Birth: 01-Apr-1941 Referring Provider:   Flowsheet Row CARDIAC REHAB PHASE II ORIENTATION from 04/27/2022 in [place]  Referring Provider [name], FNP       Encounter Date: 05/31/2022  Check In:  Session Check In - 05/31/22 0930       Check-In   Supervising physician immediately available to respond to emergencies [facility] MD immediately available    Physician(s) Dr Harl Bowie    Location [facility]    Staff Present Hoy Register MHA, MS, ACSM-CEP;[employer] BSN, RN;Heather Mel Almond, [place], Exercise Physiologist    Virtual Visit No    Medication changes reported     No    Fall or balance concerns reported    Yes    Comments He has had several falls this year. He reports that his legs give out. He walks with a cane.    Tobacco Cessation No Change    Warm-up and Cool-down Performed as group-led instruction    Resistance Training Performed Yes    VAD Patient? No    PAD/SET Patient? No      Pain Assessment   Currently in Pain? No/denies    Multiple Pain Sites No             Capillary Blood Glucose: No results found for this or any previous visit (from the past 24 hour(s)).    Social History   Tobacco Use  Smoking Status Not on file  Smokeless Tobacco Not on file    Goals Met:  Independence with exercise equipment Exercise tolerated well No report of concerns or symptoms today Strength training completed today  Goals Unmet:  Not Applicable  Comments: checkout time is 1030   Dr. Carlyle Dolly is Medical Director for [place]

## 2022-06-02 ENCOUNTER — Ambulatory Visit (HOSPITAL_COMMUNITY): Payer: No Typology Code available for payment source

## 2022-06-02 ENCOUNTER — Encounter (HOSPITAL_COMMUNITY)
Admission: RE | Admit: 2022-06-02 | Discharge: 2022-06-02 | Disposition: A | Discharge status: Home / Self Care | Payer: No Typology Code available for payment source | Source: Ambulatory Visit | Attending: Nurse Practitioner | Admitting: Nurse Practitioner

## 2022-06-02 DIAGNOSIS — I2510 Atherosclerotic heart disease of native coronary artery without angina pectoris: Secondary | ICD-10-CM | POA: Diagnosis not present

## 2022-06-02 NOTE — Progress Notes (Signed)
Daily Session Note  Patient Details  Name: Samuel Holloway. MRN: [id number] Date of Birth: [birth year] Referring Provider:   Flowsheet Row CARDIAC REHAB PHASE II ORIENTATION from 04/27/2022 in [place]  Referring Provider [name], FNP       Encounter Date: 06/02/2022  Check In:  Session Check In - 06/02/22 0926       Check-In   Supervising physician immediately available to respond to emergencies [facility] MD immediately available    Physician(s) Dr Harl Bowie    Location [facility]    Staff Present Hoy Register MHA, MS, ACSM-CEP;Leana Roe, BS, Exercise Physiologist;Clarinda Obi Hassell Done, RN, BSN;Phyllis Billingsley, RN    Virtual Visit No    Medication changes reported     No    Comments D/c lisinopril    Fall or balance concerns reported    Yes    Comments He has had several falls this year. He reports that his legs give out. He walks with a cane.    Tobacco Cessation No Change    Warm-up and Cool-down Performed as group-led instruction    Resistance Training Performed Yes    VAD Patient? No    PAD/SET Patient? No      Pain Assessment   Currently in Pain? No/denies    Multiple Pain Sites No             Capillary Blood Glucose: No results found for this or any previous visit (from the past 24 hour(s)).    Social History   Tobacco Use  Smoking Status Not on file  Smokeless Tobacco Not on file    Goals Met:  Independence with exercise equipment Exercise tolerated well No report of concerns or symptoms today Strength training completed today  Goals Unmet:  Not Applicable  Comments: Checkout at 1030.   Dr. Carlyle Dolly is Medical Director for [facility] Cardiac Rehab

## 2022-06-05 ENCOUNTER — Encounter (HOSPITAL_COMMUNITY)
Admission: RE | Admit: 2022-06-05 | Discharge: 2022-06-05 | Disposition: A | Discharge status: Home / Self Care | Payer: No Typology Code available for payment source | Source: Ambulatory Visit | Attending: Nurse Practitioner | Admitting: Nurse Practitioner

## 2022-06-05 ENCOUNTER — Ambulatory Visit (HOSPITAL_COMMUNITY): Payer: No Typology Code available for payment source

## 2022-06-05 VITALS — Wt 184.1 lb

## 2022-06-05 DIAGNOSIS — I2510 Atherosclerotic heart disease of native coronary artery without angina pectoris: Secondary | ICD-10-CM

## 2022-06-05 NOTE — Progress Notes (Signed)
Daily Session Note  Patient Details  Name: Samuel Holloway. MRN: [id number] Date of Birth: 1940/07/07 Referring Provider:   Flowsheet Row CARDIAC REHAB PHASE II ORIENTATION from 04/27/2022 in [place]  Referring Provider [name], FNP       Encounter Date: 06/05/2022  Check In:  Session Check In - 06/05/22 0929       Check-In   Supervising physician immediately available to respond to emergencies [facility] MD immediately available    Physician(s) Dr. Domenic Polite    Location [facility]    Staff Present Hoy Register MHA, MS, ACSM-CEP;Leana Roe, BS, Exercise Physiologist;Shaden Lacher, Kermit Balo, RN, BSN    Virtual Visit No    Medication changes reported     No    Fall or balance concerns reported    Yes    Comments He has had several falls this year. He reports that his legs give out. He walks with a cane.    Tobacco Cessation No Change    Warm-up and Cool-down Performed as group-led instruction    Resistance Training Performed Yes    VAD Patient? No    PAD/SET Patient? No      Pain Assessment   Currently in Pain? No/denies    Multiple Pain Sites No             Capillary Blood Glucose: No results found for this or any previous visit (from the past 24 hour(s)).    Social History   Tobacco Use  Smoking Status Not on file  Smokeless Tobacco Not on file    Goals Met:  Independence with exercise equipment Exercise tolerated well No report of concerns or symptoms today  Goals Unmet:  Not Applicable  Comments: check out @ 10:30am   Dr. Carlyle Dolly is Medical Director for [place]

## 2022-06-07 ENCOUNTER — Encounter (HOSPITAL_COMMUNITY)
Admission: RE | Admit: 2022-06-07 | Discharge: 2022-06-07 | Disposition: A | Discharge status: Home / Self Care | Payer: No Typology Code available for payment source | Source: Ambulatory Visit | Attending: Nurse Practitioner | Admitting: Nurse Practitioner

## 2022-06-07 ENCOUNTER — Ambulatory Visit (HOSPITAL_COMMUNITY): Payer: No Typology Code available for payment source

## 2022-06-07 DIAGNOSIS — I2510 Atherosclerotic heart disease of native coronary artery without angina pectoris: Secondary | ICD-10-CM | POA: Diagnosis not present

## 2022-06-07 NOTE — Progress Notes (Signed)
Daily Session Note  Patient Details  Name: Samuel Holloway. MRN: [id number] Date of Birth: 1940/07/15 Referring Provider:   Flowsheet Row CARDIAC REHAB PHASE II ORIENTATION from 04/27/2022 in [place]  Referring Provider [name], FNP       Encounter Date: 06/07/2022  Check In:  Session Check In - 06/07/22 [id number]       Check-In   Supervising physician immediately available to respond to emergencies [facility] MD immediately available    Physician(s) Dr. Domenic Polite    Location [facility]    Staff Present Aundra Dubin, RN, BSN;Hillary Troutman BSN, RN;Dalton Sherrie George, MS, ACSM-CEP    Virtual Visit No    Medication changes reported     No    Fall or balance concerns reported    Yes    Comments He has had several falls this year. He reports that his legs give out. He walks with a cane.    Tobacco Cessation No Change    Warm-up and Cool-down Performed as group-led instruction    Resistance Training Performed Yes    VAD Patient? No    PAD/SET Patient? No      Pain Assessment   Currently in Pain? No/denies    Multiple Pain Sites No             Capillary Blood Glucose: No results found for this or any previous visit (from the past 24 hour(s)).    Social History   Tobacco Use  Smoking Status Not on file  Smokeless Tobacco Not on file    Goals Met:  Independence with exercise equipment Exercise tolerated well No report of concerns or symptoms today Strength training completed today  Goals Unmet:  Not Applicable  Comments: Check out 1030.   Dr. Carlyle Dolly is Medical Director for [facility] Cardiac Rehab

## 2022-06-09 ENCOUNTER — Encounter (HOSPITAL_COMMUNITY)
Admission: RE | Admit: 2022-06-09 | Discharge: 2022-06-09 | Disposition: A | Discharge status: Home / Self Care | Payer: No Typology Code available for payment source | Source: Ambulatory Visit | Attending: Nurse Practitioner | Admitting: Nurse Practitioner

## 2022-06-09 ENCOUNTER — Ambulatory Visit (HOSPITAL_COMMUNITY): Payer: No Typology Code available for payment source

## 2022-06-09 DIAGNOSIS — I2510 Atherosclerotic heart disease of native coronary artery without angina pectoris: Secondary | ICD-10-CM

## 2022-06-09 NOTE — Progress Notes (Signed)
Daily Session Note  Patient Details  Name: Samuel Holloway. MRN: [id number] Date of Birth: 1941/04/13 Referring Provider:   Flowsheet Row CARDIAC REHAB PHASE II ORIENTATION from 04/27/2022 in [place]  Referring Provider [name], FNP       Encounter Date: 06/09/2022  Check In:  Session Check In - 06/09/22 0929       Check-In   Supervising physician immediately available to respond to emergencies [facility] MD immediately available    Physician(s) Dr Johnsie Cancel    Location [facility]    Staff Present Aundra Dubin, RN, Joanette Gula, RN, BSN;Heather Mel Almond, BS, Exercise Physiologist    Virtual Visit No    Medication changes reported     No    Comments D/c lisinopril    Fall or balance concerns reported    Yes    Comments He has had several falls this year. He reports that his legs give out. He walks with a cane.    Tobacco Cessation No Change    Warm-up and Cool-down Performed as group-led instruction    Resistance Training Performed Yes    VAD Patient? No    PAD/SET Patient? No      Pain Assessment   Currently in Pain? No/denies    Multiple Pain Sites No             Capillary Blood Glucose: No results found for this or any previous visit (from the past 24 hour(s)).    Social History   Tobacco Use  Smoking Status Not on file  Smokeless Tobacco Not on file    Goals Met:  Independence with exercise equipment Exercise tolerated well No report of concerns or symptoms today Strength training completed today  Goals Unmet:  Not Applicable  Comments: Checkout at 1030.   Dr. Carlyle Dolly is Medical Director for [facility] Cardiac Rehab

## 2022-06-12 ENCOUNTER — Encounter (HOSPITAL_COMMUNITY)
Admission: RE | Admit: 2022-06-12 | Discharge: 2022-06-12 | Disposition: A | Discharge status: Home / Self Care | Payer: No Typology Code available for payment source | Source: Ambulatory Visit | Attending: Nurse Practitioner | Admitting: Nurse Practitioner

## 2022-06-12 ENCOUNTER — Ambulatory Visit (HOSPITAL_COMMUNITY): Payer: No Typology Code available for payment source

## 2022-06-12 DIAGNOSIS — I2510 Atherosclerotic heart disease of native coronary artery without angina pectoris: Secondary | ICD-10-CM

## 2022-06-12 NOTE — Progress Notes (Signed)
Daily Session Note  Patient Details  Name: Samuel Holloway. MRN: [id number] Date of Birth: [birth year] Referring Provider:   Flowsheet Row CARDIAC REHAB PHASE II ORIENTATION from 04/27/2022 in [place]  Referring Provider [name], FNP       Encounter Date: 06/12/2022  Check In:  Session Check In - 06/12/22 0923       Check-In   Supervising physician immediately available to respond to emergencies [facility] MD immediately available    Physician(s) Dr Dellia Cloud    Location [facility]    Staff Present Annie Roseboom Hassell Done, RN, BSN;Heather Mel Almond, BS, Exercise Physiologist;Dalton Sherrie George, MS, ACSM-CEP    Virtual Visit No    Medication changes reported     No    Comments D/c lisinopril    Fall or balance concerns reported    Yes    Comments He has had several falls this year. He reports that his legs give out. He walks with a cane.    Tobacco Cessation No Change    Warm-up and Cool-down Performed as group-led instruction    Resistance Training Performed Yes    VAD Patient? No    PAD/SET Patient? No      Pain Assessment   Currently in Pain? No/denies    Multiple Pain Sites No             Capillary Blood Glucose: No results found for this or any previous visit (from the past 24 hour(s)).    Social History   Tobacco Use  Smoking Status Not on file  Smokeless Tobacco Not on file    Goals Met:  Independence with exercise equipment Exercise tolerated well No report of concerns or symptoms today Strength training completed today  Goals Unmet:  Not Applicable  Comments: Checkout at 1030.   Dr. Carlyle Dolly is Medical Director for [facility] Cardiac Rehab

## 2022-06-14 ENCOUNTER — Encounter (HOSPITAL_COMMUNITY)
Admission: RE | Admit: 2022-06-14 | Discharge: 2022-06-14 | Disposition: A | Discharge status: Home / Self Care | Payer: No Typology Code available for payment source | Source: Ambulatory Visit | Attending: Nurse Practitioner | Admitting: Nurse Practitioner

## 2022-06-14 ENCOUNTER — Ambulatory Visit (HOSPITAL_COMMUNITY): Payer: No Typology Code available for payment source

## 2022-06-14 DIAGNOSIS — I2510 Atherosclerotic heart disease of native coronary artery without angina pectoris: Secondary | ICD-10-CM

## 2022-06-14 LAB — GLUCOSE, CAPILLARY: Glucose-Capillary: 168 mg/dL — ABNORMAL HIGH (ref 70–99)

## 2022-06-14 NOTE — Progress Notes (Signed)
Daily Session Note  Patient Details  Name: Samuel Holloway. MRN: [id number] Date of Birth: [birth year] Referring Provider:   Flowsheet Row CARDIAC REHAB PHASE II ORIENTATION from 04/27/2022 in [place]  Referring Provider Samuel Homes, FNP       Encounter Date: 06/14/2022  Check In:  Session Check In - 06/14/22 0930       Check-In   Supervising physician immediately available to respond to emergencies [facility] MD immediately available    Physician(s) Dr Dellia Cloud    Location [facility]    Staff Present Samuel Holloway MHA, MS, ACSM-CEP;Samuel Holloway, BS, Exercise Physiologist    Virtual Visit No    Medication changes reported     No    Fall or balance concerns reported    Yes    Comments He has had several falls this year. He reports that his legs give out. He walks with a cane.    Tobacco Cessation No Change    Warm-up and Cool-down Performed as group-led instruction    Resistance Training Performed Yes    VAD Patient? No    PAD/SET Patient? No      Pain Assessment   Currently in Pain? No/denies    Multiple Pain Sites No             Capillary Blood Glucose: No results found for this or any previous visit (from the past 24 hour(s)).    Social History   Tobacco Use  Smoking Status Not on file  Smokeless Tobacco Not on file    Goals Met:  Independence with exercise equipment Exercise tolerated well No report of concerns or symptoms today Strength training completed today  Goals Unmet:  Not Applicable  Comments: checkout time is 1030   Dr. Carlyle Holloway is Medical Director for [place]

## 2022-06-16 ENCOUNTER — Encounter (HOSPITAL_COMMUNITY)
Admission: RE | Admit: 2022-06-16 | Discharge: 2022-06-16 | Disposition: A | Discharge status: Home / Self Care | Payer: No Typology Code available for payment source | Source: Ambulatory Visit | Attending: Nurse Practitioner | Admitting: Nurse Practitioner

## 2022-06-16 ENCOUNTER — Ambulatory Visit (HOSPITAL_COMMUNITY): Payer: No Typology Code available for payment source

## 2022-06-16 DIAGNOSIS — I2510 Atherosclerotic heart disease of native coronary artery without angina pectoris: Secondary | ICD-10-CM

## 2022-06-16 NOTE — Progress Notes (Signed)
Daily Session Note  Patient Details  Name: Samuel Holloway. MRN: [id number] Date of Birth: February 01, 1941 Referring Provider:   Flowsheet Row CARDIAC REHAB PHASE II ORIENTATION from 04/27/2022 in [place]  Referring Provider [name], FNP       Encounter Date: 06/16/2022  Check In:  Session Check In - 06/16/22 0918       Check-In   Supervising physician immediately available to respond to emergencies [facility] MD immediately available    Physician(s) Dr Dellia Cloud    Location [facility]    Staff Present Leana Roe, BS, Exercise Physiologist;Yarenis Cerino Wynetta Emery, RN, BSN;Dalton Fletcher MHA, MS, ACSM-CEP    Virtual Visit No    Medication changes reported     No    Fall or balance concerns reported    Yes    Comments He has had several falls this year. He reports that his legs give out. He walks with a cane.    Tobacco Cessation No Change    Warm-up and Cool-down Performed as group-led instruction    VAD Patient? No    PAD/SET Patient? No      Pain Assessment   Currently in Pain? No/denies    Multiple Pain Sites No             Capillary Blood Glucose: No results found for this or any previous visit (from the past 24 hour(s)).    Social History   Tobacco Use  Smoking Status Not on file  Smokeless Tobacco Not on file    Goals Met:  Independence with exercise equipment Exercise tolerated well No report of concerns or symptoms today Strength training completed today  Goals Unmet:  Not Applicable  Comments: Check out    Dr. Carlyle Dolly is Medical Director for [place]

## 2022-06-19 ENCOUNTER — Ambulatory Visit (HOSPITAL_COMMUNITY): Payer: No Typology Code available for payment source

## 2022-06-19 ENCOUNTER — Encounter (HOSPITAL_COMMUNITY)
Admission: RE | Admit: 2022-06-19 | Discharge: 2022-06-19 | Disposition: A | Discharge status: Home / Self Care | Payer: No Typology Code available for payment source | Source: Ambulatory Visit | Attending: Nurse Practitioner | Admitting: Nurse Practitioner

## 2022-06-19 VITALS — Wt 182.1 lb

## 2022-06-19 DIAGNOSIS — I2510 Atherosclerotic heart disease of native coronary artery without angina pectoris: Secondary | ICD-10-CM | POA: Diagnosis not present

## 2022-06-19 NOTE — Progress Notes (Signed)
Daily Session Note  Patient Details  Name: Samuel Holloway. MRN: [id number] Date of Birth: Sep 21, 1940 Referring Provider:   Flowsheet Row CARDIAC REHAB PHASE II ORIENTATION from 04/27/2022 in [place]  Referring Provider [name], FNP       Encounter Date: 06/19/2022  Check In:  Session Check In - 06/19/22 0926       Check-In   Supervising physician immediately available to respond to emergencies [facility] MD immediately available    Physician(s) Dr Harl Bowie    Location [facility]    Staff Present Leana Roe, BS, Exercise Physiologist;Dalton Sherrie George, MS, ACSM-CEP;Madelyn Flavors, RN, BSN    Virtual Visit No    Medication changes reported     No    Comments D/c lisinopril    Fall or balance concerns reported    Yes    Comments He has had several falls this year. He reports that his legs give out. He walks with a cane.    Tobacco Cessation No Change    Warm-up and Cool-down Performed as group-led instruction    Resistance Training Performed Yes    VAD Patient? No    PAD/SET Patient? No      Pain Assessment   Currently in Pain? No/denies    Multiple Pain Sites No             Capillary Blood Glucose: No results found for this or any previous visit (from the past 24 hour(s)).    Social History   Tobacco Use  Smoking Status Not on file  Smokeless Tobacco Not on file    Goals Met:  Independence with exercise equipment Exercise tolerated well No report of concerns or symptoms today Strength training completed today  Goals Unmet:  Not Applicable  Comments: checkout at 1030.   Dr. Carlyle Dolly is Medical Director for [facility] Cardiac Rehab

## 2022-06-21 ENCOUNTER — Ambulatory Visit (HOSPITAL_COMMUNITY): Payer: No Typology Code available for payment source

## 2022-06-21 ENCOUNTER — Encounter (HOSPITAL_COMMUNITY)
Admission: RE | Admit: 2022-06-21 | Discharge: 2022-06-21 | Disposition: A | Discharge status: Home / Self Care | Payer: No Typology Code available for payment source | Source: Ambulatory Visit | Attending: Nurse Practitioner | Admitting: Nurse Practitioner

## 2022-06-21 DIAGNOSIS — I2510 Atherosclerotic heart disease of native coronary artery without angina pectoris: Secondary | ICD-10-CM

## 2022-06-21 NOTE — Progress Notes (Incomplete)
Cardiac Individual Treatment Plan  Patient Details  Name: Samuel Holloway. MRN: [id number] Date of Birth: 10/07/1940 Referring Provider:   Flowsheet Row CARDIAC REHAB PHASE II ORIENTATION from 04/27/2022 in [facility]  Referring Provider [name], FNP       Initial Encounter Date:  Flowsheet Row CARDIAC REHAB PHASE II ORIENTATION from 04/27/2022 in [place]  Date 04/27/22       Visit Diagnosis: Atherosclerosis of native coronary artery of native heart without angina pectoris  Patient's Home Medications on Admission:  Current Outpatient Medications:    aspirin EC 81 MG tablet, Take 81 mg by mouth daily., Disp: , Rfl:    atorvastatin (LIPITOR) 80 MG tablet, Take 40 mg by mouth daily., Disp: , Rfl:    BRILINTA 90 MG TABS tablet, Take 90 mg by mouth 2 times daily at 12 noon and 4 pm., Disp: , Rfl:    Calcium Carb-Cholecalciferol 600-10 MG-MCG TABS, Take 1 tablet by mouth 2 (two) times daily., Disp: , Rfl:    carboxymethylcellulose (REFRESH PLUS) 0.5 % SOLN, Place 1 drop into both eyes at bedtime., Disp: , Rfl:    cholecalciferol (VITAMIN D3) 25 MCG (1000 UNIT) tablet, Take 1,000 Units by mouth daily., Disp: , Rfl:    diclofenac (VOLTAREN) 75 MG EC tablet, Take 75 mg by mouth 2 (two) times daily as needed for mild pain., Disp: , Rfl:    fluticasone (FLONASE) 50 MCG/ACT nasal spray, Place 1 spray into both nostrils daily., Disp: , Rfl:    hydrochlorothiazide (HYDRODIURIL) 25 MG tablet, Take 12.5 mg by mouth daily., Disp: , Rfl:    isosorbide mononitrate (IMDUR) 30 MG 24 hr tablet, Take 30 mg by mouth daily., Disp: , Rfl:    levothyroxine (SYNTHROID) 200 MCG tablet, Take 200 mcg by mouth daily before breakfast., Disp: , Rfl:    LYCOPENE PO, Take 1 tablet by mouth daily., Disp: , Rfl:    metFORMIN (GLUCOPHAGE) 1000 MG tablet, Take 1,000 mg by mouth 2 (two) times daily with a meal., Disp: , Rfl:    metoprolol succinate (TOPROL-XL) 100  MG 24 hr tablet, Take 50 mg by mouth daily., Disp: , Rfl:    omeprazole (PRILOSEC) 40 MG capsule, Take 40 mg by mouth daily., Disp: , Rfl:    oxybutynin (DITROPAN) 5 MG tablet, Take 5 mg by mouth 2 (two) times daily., Disp: , Rfl:    polyethylene glycol powder (GLYCOLAX/MIRALAX) 17 GM/SCOOP powder, Take 17 g by mouth daily as needed for mild constipation., Disp: , Rfl:    senna-docusate (SENOKOT-S) 8.6-50 MG tablet, Take 2 tablets by mouth at bedtime as needed for mild constipation., Disp: , Rfl:    sertraline (ZOLOFT) 100 MG tablet, Take 100 mg by mouth daily., Disp: , Rfl:    sodium chloride (OCEAN) 0.65 % nasal spray, Place 1 spray into the nose every 2 (two) hours., Disp: , Rfl:    Timolol Maleate, Once-Daily, 0.5 % SOLN, Apply 1 drop to eye daily., Disp: , Rfl:   Past Medical History: No past medical history on file.  Tobacco Use: Social History   Tobacco Use  Smoking Status Not on file  Smokeless Tobacco Not on file    Labs: Review Flowsheet       Latest Ref Rng & Units 01/23/2005 08/05/2008  Labs for ITP Cardiac and Pulmonary Rehab  Cholestrol 0 - 200 mg/dL - 249   LDL (calc) 0 - 99 mg/dL 109  141   HDL-C >39  mg/dL - 35   Trlycerides <150 mg/dL - 363   Hemoglobin A1c % - 6.4     Capillary Blood Glucose: Lab Results  Component Value Date   GLUCAP 168 (H) 06/14/2022   GLUCAP 252 (H) 05/29/2022   GLUCAP 239 (H) 05/12/2022   GLUCAP 184 (H) 05/08/2022   GLUCAP 246 (H) 05/03/2022    POCT Glucose     Row Name 04/27/22 1024             POCT Blood Glucose   Pre-Exercise 175 mg/dL                Exercise Target Goals: Exercise Program Goal: Individual exercise prescription set using results from initial 6 min walk test and THRR while considering  patient's activity barriers and safety.   Exercise Prescription Goal: Starting with aerobic activity 30 plus minutes a day, 3 days per week for initial exercise prescription. Provide home exercise prescription and  guidelines that participant acknowledges understanding prior to discharge.  Activity Barriers & Risk Stratification:  Activity Barriers & Cardiac Risk Stratification - 04/27/22 0838       Activity Barriers & Cardiac Risk Stratification   Activity Barriers Arthritis;Back Problems;Neck/Spine Problems;Joint Problems;Deconditioning;Muscular Weakness;Balance Concerns;History of Falls;Assistive Device    Cardiac Risk Stratification High             6 Minute Walk:  6 Minute Walk     Row Name 04/27/22 1020         6 Minute Walk   Phase Initial     Distance 1050 feet     Walk Time 6 minutes     # of Rest Breaks 0     MPH 1.99     METS 1.84     RPE 9     VO2 Peak 6.45     Symptoms No     Resting HR 73 bpm     Resting BP 100/52     Resting Oxygen Saturation  99 %     Exercise Oxygen Saturation  during 6 min walk 99 %     Max Ex. HR 86 bpm     Max Ex. BP 122/60     2 Minute Post BP 102/60              Oxygen Initial Assessment:   Oxygen Re-Evaluation:   Oxygen Discharge (Final Oxygen Re-Evaluation):   Initial Exercise Prescription:  Initial Exercise Prescription - 04/27/22 1000       Date of Initial Exercise RX and Referring Provider   Date 04/27/22    Referring Provider [name], FNP    Expected Discharge Date 07/21/22      NuStep   Level 1    SPM 80    Minutes 22      Arm Ergometer   Level 1    RPM 60    Minutes 17      Prescription Details   Frequency (times per week) 3    Duration Progress to 30 minutes of continuous aerobic without signs/symptoms of physical distress      Intensity   THRR 40-80% of Max Heartrate 56-112    Ratings of Perceived Exertion 11-13    Perceived Dyspnea 0-4      Resistance Training   Training Prescription Yes    Weight 4    Reps 10-15             Perform Capillary Blood Glucose checks as needed.  Exercise Prescription Changes:  Exercise Prescription Changes     Row Name 05/08/22 1200  05/17/22 1428 06/05/22 1400 06/19/22 1100       Response to Exercise   Blood Pressure (Admit) 96/62 102/68 104/60 106/62    Blood Pressure (Exercise) 138/70 128/64 146/72 168/88    Blood Pressure (Exit) 120/60 94/60 102/60 94/64    Heart Rate (Admit) 76 bpm 86 bpm 95 bpm 85 bpm    Heart Rate (Exercise) 107 bpm 108 bpm 115 bpm 117 bpm    Heart Rate (Exit) 85 bpm 95 bpm 104 bpm 94 bpm    Rating of Perceived Exertion (Exercise) '13 13 13 13    '$ Duration Continue with 30 min of aerobic exercise without signs/symptoms of physical distress. Continue with 30 min of aerobic exercise without signs/symptoms of physical distress. Continue with 30 min of aerobic exercise without signs/symptoms of physical distress. Continue with 30 min of aerobic exercise without signs/symptoms of physical distress.    Intensity THRR unchanged THRR unchanged THRR unchanged THRR unchanged      Progression   Progression Continue to progress workloads to maintain intensity without signs/symptoms of physical distress. Continue to progress workloads to maintain intensity without signs/symptoms of physical distress. Continue to progress workloads to maintain intensity without signs/symptoms of physical distress. Continue to progress workloads to maintain intensity without signs/symptoms of physical distress.      Resistance Training   Training Prescription Yes Yes Yes Yes    Weight '3 4 5 5    '$ Reps 10-15 10-15 10-15 10-15    Time 10 Minutes 10 Minutes 10 Minutes 10 Minutes      NuStep   Level '2 2 2 3    '$ SPM 98 118 118 113    Minutes '22 22 22 22    '$ METs 2.59 3.27 3.26 3.39      Arm Ergometer   Level '2 2 2 '$ 2.5    RPM 46 50 60 56    Minutes '17 17 17 17    '$ METs 1.97 2.1 2.5 2.65             Exercise Comments:   Exercise Goals and Review:   Exercise Goals     Row Name 04/27/22 1023 05/22/22 1429 06/19/22 1133         Exercise Goals   Increase Physical Activity Yes Yes Yes     Intervention Provide advice,  education, support and counseling about physical activity/exercise needs.;Develop an individualized exercise prescription for aerobic and resistive training based on initial evaluation findings, risk stratification, comorbidities and participant's personal goals. Provide advice, education, support and counseling about physical activity/exercise needs.;Develop an individualized exercise prescription for aerobic and resistive training based on initial evaluation findings, risk stratification, comorbidities and participant's personal goals. Provide advice, education, support and counseling about physical activity/exercise needs.;Develop an individualized exercise prescription for aerobic and resistive training based on initial evaluation findings, risk stratification, comorbidities and participant's personal goals.     Expected Outcomes Short Term: Attend rehab on a regular basis to increase amount of physical activity.;Long Term: Add in home exercise to make exercise part of routine and to increase amount of physical activity.;Long Term: Exercising regularly at least 3-5 days a week. Short Term: Attend rehab on a regular basis to increase amount of physical activity.;Long Term: Add in home exercise to make exercise part of routine and to increase amount of physical activity.;Long Term: Exercising regularly at least 3-5 days a week. Short Term: Attend rehab on a regular basis to increase  amount of physical activity.;Long Term: Add in home exercise to make exercise part of routine and to increase amount of physical activity.;Long Term: Exercising regularly at least 3-5 days a week.     Increase Strength and Stamina Yes Yes Yes     Intervention Provide advice, education, support and counseling about physical activity/exercise needs.;Develop an individualized exercise prescription for aerobic and resistive training based on initial evaluation findings, risk stratification, comorbidities and participant's personal  goals. Provide advice, education, support and counseling about physical activity/exercise needs.;Develop an individualized exercise prescription for aerobic and resistive training based on initial evaluation findings, risk stratification, comorbidities and participant's personal goals. Provide advice, education, support and counseling about physical activity/exercise needs.;Develop an individualized exercise prescription for aerobic and resistive training based on initial evaluation findings, risk stratification, comorbidities and participant's personal goals.     Expected Outcomes Short Term: Increase workloads from initial exercise prescription for resistance, speed, and METs.;Short Term: Perform resistance training exercises routinely during rehab and add in resistance training at home;Long Term: Improve cardiorespiratory fitness, muscular endurance and strength as measured by increased METs and functional capacity (6MWT) Short Term: Increase workloads from initial exercise prescription for resistance, speed, and METs.;Short Term: Perform resistance training exercises routinely during rehab and add in resistance training at home;Long Term: Improve cardiorespiratory fitness, muscular endurance and strength as measured by increased METs and functional capacity (6MWT) Short Term: Increase workloads from initial exercise prescription for resistance, speed, and METs.;Short Term: Perform resistance training exercises routinely during rehab and add in resistance training at home;Long Term: Improve cardiorespiratory fitness, muscular endurance and strength as measured by increased METs and functional capacity (6MWT)     Able to understand and use rate of perceived exertion (RPE) scale Yes Yes Yes     Intervention Provide education and explanation on how to use RPE scale Provide education and explanation on how to use RPE scale Provide education and explanation on how to use RPE scale     Expected Outcomes Short Term:  Able to use RPE daily in rehab to express subjective intensity level;Long Term:  Able to use RPE to guide intensity level when exercising independently Short Term: Able to use RPE daily in rehab to express subjective intensity level;Long Term:  Able to use RPE to guide intensity level when exercising independently Short Term: Able to use RPE daily in rehab to express subjective intensity level;Long Term:  Able to use RPE to guide intensity level when exercising independently     Knowledge and understanding of Target Heart Rate Range (THRR) Yes Yes Yes     Intervention Provide education and explanation of THRR including how the numbers were predicted and where they are located for reference Provide education and explanation of THRR including how the numbers were predicted and where they are located for reference Provide education and explanation of THRR including how the numbers were predicted and where they are located for reference     Expected Outcomes Short Term: Able to state/look up THRR;Short Term: Able to use daily as guideline for intensity in rehab;Long Term: Able to use THRR to govern intensity when exercising independently Short Term: Able to state/look up THRR;Short Term: Able to use daily as guideline for intensity in rehab;Long Term: Able to use THRR to govern intensity when exercising independently Short Term: Able to state/look up THRR;Short Term: Able to use daily as guideline for intensity in rehab;Long Term: Able to use THRR to govern intensity when exercising independently     Able to  check pulse independently Yes Yes Yes     Intervention Provide education and demonstration on how to check pulse in carotid and radial arteries.;Review the importance of being able to check your own pulse for safety during independent exercise Provide education and demonstration on how to check pulse in carotid and radial arteries.;Review the importance of being able to check your own pulse for safety during  independent exercise Provide education and demonstration on how to check pulse in carotid and radial arteries.;Review the importance of being able to check your own pulse for safety during independent exercise     Expected Outcomes Short Term: Able to explain why pulse checking is important during independent exercise;Long Term: Able to check pulse independently and accurately Short Term: Able to explain why pulse checking is important during independent exercise;Long Term: Able to check pulse independently and accurately Short Term: Able to explain why pulse checking is important during independent exercise;Long Term: Able to check pulse independently and accurately     Understanding of Exercise Prescription Yes Yes Yes     Intervention Provide education, explanation, and written materials on patient's individual exercise prescription Provide education, explanation, and written materials on patient's individual exercise prescription Provide education, explanation, and written materials on patient's individual exercise prescription     Expected Outcomes Short Term: Able to explain program exercise prescription;Long Term: Able to explain home exercise prescription to exercise independently Short Term: Able to explain program exercise prescription;Long Term: Able to explain home exercise prescription to exercise independently Short Term: Able to explain program exercise prescription;Long Term: Able to explain home exercise prescription to exercise independently              Exercise Goals Re-Evaluation :  Exercise Goals Re-Evaluation     Row Name 05/22/22 1429 06/19/22 1133           Exercise Goal Re-Evaluation   Exercise Goals Review Increase Strength and Stamina;Increase Physical Activity;Able to understand and use rate of perceived exertion (RPE) scale;Knowledge and understanding of Target Heart Rate Range (THRR);Able to check pulse independently;Understanding of Exercise Prescription Increase  Physical Activity;Increase Strength and Stamina;Able to understand and use rate of perceived exertion (RPE) scale;Knowledge and understanding of Target Heart Rate Range (THRR);Able to check pulse independently;Understanding of Exercise Prescription      Comments Pt has completed 8 sessions of CR. He is egar to come to class and exercise. The last two sessions his HR has been over 100 bpm at rest (114bpm & 108bpm) causing him to not to be able to exercise. He was currently exercising at 3.27 METs on the stepper. Will continue to monitor and progress as able. Pt has completed 22 sessions of cardiac rehab. His HR at start was raised to below 110bpm so he is able to exercise. He still continues  to be eager to come to class and exerccise. He is currently exercising at 3.39 METs on the stepper. Will continue to monitor and progress as able.      Expected Outcomes Through exercise at rehab and home, the patient will meet theirn stated goals. Through exercise at rehab and home, the patient will meet theirn stated goals.                Discharge Exercise Prescription (Final Exercise Prescription Changes):  Exercise Prescription Changes - 06/19/22 1100       Response to Exercise   Blood Pressure (Admit) 106/62    Blood Pressure (Exercise) 168/88    Blood Pressure (Exit) [phone]  Heart Rate (Admit) 85 bpm    Heart Rate (Exercise) 117 bpm    Heart Rate (Exit) 94 bpm    Rating of Perceived Exertion (Exercise) 13    Duration Continue with 30 min of aerobic exercise without signs/symptoms of physical distress.    Intensity THRR unchanged      Progression   Progression Continue to progress workloads to maintain intensity without signs/symptoms of physical distress.      Resistance Training   Training Prescription Yes    Weight 5    Reps 10-15    Time 10 Minutes      NuStep   Level 3    SPM 113    Minutes 22    METs 3.39      Arm Ergometer   Level 2.5    RPM 56    Minutes 17    METs 2.65              Nutrition:  Target Goals: Understanding of nutrition guidelines, daily intake of sodium '1500mg'$ , cholesterol '200mg'$ , calories 30% from fat and 7% or less from saturated fats, daily to have 5 or more servings of fruits and vegetables.  Biometrics:  Pre Biometrics - 04/27/22 1023       Pre Biometrics   Height '5\' 11"'$  (1.803 m)    Weight 86.9 kg    Waist Circumference 40.5 inches    Hip Circumference 43 inches    Waist to Hip Ratio 0.94 %    BMI (Calculated) 26.73    Triceps Skinfold 23 mm    % Body Fat 29.3 %    Grip Strength 23.1 kg    Flexibility 0 in    Single Leg Stand 0 seconds              Nutrition Therapy Plan and Nutrition Goals:  Nutrition Therapy & Goals - 05/15/22 1029       Personal Nutrition Goals   Comments Patient's diet assessment score was 51. We offer 2 educational sessions on heart healthy nutrition with handouts and assistance with RD referral if patient is interested.      Intervention Plan   Intervention Nutrition handout(s) given to patient.    Expected Outcomes Short Term Goal: Understand basic principles of dietary content, such as calories, fat, sodium, cholesterol and nutrients.             Nutrition Assessments:  Nutrition Assessments - 04/27/22 0850       MEDFICTS Scores   Pre Score 51            MEDIFICTS Score Key: ?70 Need to make dietary changes  40-70 Heart Healthy Diet ? 40 Therapeutic Level Cholesterol Diet   Picture Your Plate Scores: [id number] Unhealthy dietary pattern with much room for improvement. 41-50 Dietary pattern unlikely to meet recommendations for good health and room for improvement. 51-60 More healthful dietary pattern, with some room for improvement.  >60 Healthy dietary pattern, although there may be some specific behaviors that could be improved.    Nutrition Goals Re-Evaluation:   Nutrition Goals Discharge (Final Nutrition Goals Re-Evaluation):   Psychosocial: Target Goals:  Acknowledge presence or absence of significant depression and/or stress, maximize coping skills, provide positive support system. Participant is able to verbalize types and ability to use techniques and skills needed for reducing stress and depression.  Initial Review & Psychosocial Screening:  Initial Psych Review & Screening - 04/27/22 0840       Initial Review   Current issues  with None Identified      Family Dynamics   Good Support System? Yes    Comments His daughter is his support system.      Barriers   Psychosocial barriers to participate in program There are no identifiable barriers or psychosocial needs.      Screening Interventions   Interventions Encouraged to exercise    Expected Outcomes Long Term goal: The participant improves quality of Life and PHQ9 Scores as seen by post scores and/or verbalization of changes;Short Term goal: Identification and review with participant of any Quality of Life or Depression concerns found by scoring the questionnaire.             Quality of Life Scores:  Quality of Life - 04/27/22 1020       Quality of Life   Select Quality of Life      Quality of Life Scores   Health/Function Pre 22.15 %    Socioeconomic Pre 22.5 %    Psych/Spiritual Pre 27.43 %    Family Pre 19.2 %    GLOBAL Pre 22.97 %            Scores of 19 and below usually indicate a poorer quality of life in these areas.  A difference of  2-3 points is a clinically meaningful difference.  A difference of 2-3 points in the total score of the Quality of Life Index has been associated with significant improvement in overall quality of life, self-image, physical symptoms, and general health in studies assessing change in quality of life.  PHQ-9: Review Flowsheet       04/27/2022  Depression screen PHQ 2/9  Decreased Interest 0  Down, Depressed, Hopeless 0  PHQ - 2 Score 0  Altered sleeping 0  Tired, decreased energy 1  Change in appetite 1  Feeling bad or  failure about yourself  0  Trouble concentrating 0  Moving slowly or fidgety/restless 0  Suicidal thoughts 0  PHQ-9 Score 2  Difficult doing work/chores Somewhat difficult   Interpretation of Total Score  Total Score Depression Severity:  1-4 = Minimal depression, 5-9 = Mild depression, 10-14 = Moderate depression, 15-19 = Moderately severe depression, 20-27 = Severe depression   Psychosocial Evaluation and Intervention:  Psychosocial Evaluation - 04/27/22 1506       Psychosocial Evaluation & Interventions   Interventions Encouraged to exercise with the program and follow exercise prescription;Relaxation education;Stress management education    Comments Pt has no barriers to participating in CR. He has no identifiable psychosocial issues. He scored a 2 on his PHQ-9, and this relates to his poor appetite and his lack of energy. He lives by himself and he frequently goes to [place] to eat out, and due to this he eats a lot of fried foods. He lists his supports system as his daughter. He has several children and grand children who live in [place], but he only routinely sees one of his daughters. He reports that his goals while in the program are to increase his strength and stamina. He was excited when he was fitted on the machines. He states that he used to exercise and go to the gym a lot when his was in the [employer] in his younger years.    Expected Outcomes Pt will continue to have no identifiable psychosocial issues.    Continue Psychosocial Services  No Follow up required             Psychosocial Re-Evaluation:  Psychosocial Re-Evaluation  [place] Name 05/17/22 [id number] 06/12/22 1358           Psychosocial Re-Evaluation   Current issues with None Identified None Identified      Comments Patient is new to the program completing 6 sessisons. He is doing well in the program. He is on Zoloft to manage depression. He continues to have no psychosocial barriers identified. We will  continue to monitor his progress. Patient had completed 18 sessisons. He continues to do well in the program. He continues on Zoloft to manage depression. He continues to have no psychosocial barriers identified. He enjoys the sessions and demonstrates an interest to improve his health. We will continue to monitor his progress.      Expected Outcomes Patient will complete the program meeting both personal and program goals. Patient will complete the program meeting both personal and program goals.      Interventions Encouraged to attend Cardiac Rehabilitation for the exercise;Relaxation education;Stress management education Encouraged to attend Cardiac Rehabilitation for the exercise;Relaxation education;Stress management education      Continue Psychosocial Services  No Follow up required No Follow up required               Psychosocial Discharge (Final Psychosocial Re-Evaluation):  Psychosocial Re-Evaluation - 06/12/22 1358       Psychosocial Re-Evaluation   Current issues with None Identified    Comments Patient had completed 18 sessisons. He continues to do well in the program. He continues on Zoloft to manage depression. He continues to have no psychosocial barriers identified. He enjoys the sessions and demonstrates an interest to improve his health. We will continue to monitor his progress.    Expected Outcomes Patient will complete the program meeting both personal and program goals.    Interventions Encouraged to attend Cardiac Rehabilitation for the exercise;Relaxation education;Stress management education    Continue Psychosocial Services  No Follow up required             Vocational Rehabilitation: Provide vocational rehab assistance to qualifying candidates.   Vocational Rehab Evaluation & Intervention:  Vocational Rehab - 04/27/22 0854       Initial Vocational Rehab Evaluation & Intervention   Assessment shows need for Vocational Rehabilitation No      Vocational  Rehab Re-Evaulation   Comments retired             Education: Education Goals: Education classes will be provided on a weekly basis, covering required topics. Participant will state understanding/return demonstration of topics presented.  Learning Barriers/Preferences:  Learning Barriers/Preferences - 04/27/22 0850       Learning Barriers/Preferences   Learning Barriers Sight    Learning Preferences Audio;Group Instruction;Individual Instruction;Skilled Demonstration;Pictoral;Verbal Instruction;Written Material             Education Topics: Hypertension, Hypertension Reduction -Define heart disease and high blood pressure. Discus how high blood pressure affects the body and ways to reduce high blood pressure.   Exercise and Your Heart -Discuss why it is important to exercise, the FITT principles of exercise, normal and abnormal responses to exercise, and how to exercise safely. Flowsheet Row CARDIAC REHAB PHASE II EXERCISE from 06/14/2022 in [place]  Date 05/03/22  Educator DF  Instruction Review Code 2- Demonstrated Understanding       Angina -Discuss definition of angina, causes of angina, treatment of angina, and how to decrease risk of having angina. Flowsheet Row CARDIAC REHAB PHASE II EXERCISE from 06/14/2022 in [place]  Date 05/10/22  Educator DF  Instruction Review Code 1- Verbalizes Understanding       Cardiac Medications -Review what the following cardiac medications are used for, how they affect the body, and side effects that may occur when taking the medications.  Medications include Aspirin, Beta blockers, calcium channel blockers, ACE Inhibitors, angiotensin receptor blockers, diuretics, digoxin, and antihyperlipidemics. Flowsheet Row CARDIAC REHAB PHASE II EXERCISE from 06/14/2022 in [place]  Date 05/24/22  Educator DF  Instruction Review Code 1- Verbalizes Understanding        Congestive Heart Failure -Discuss the definition of CHF, how to live with CHF, the signs and symptoms of CHF, and how keep track of weight and sodium intake. Flowsheet Row CARDIAC REHAB PHASE II EXERCISE from 06/14/2022 in [place]  Date 05/17/22  Educator DF  Instruction Review Code 2- Demonstrated Understanding       Heart Disease and Intimacy -Discus the effect sexual activity has on the heart, how changes occur during intimacy as we age, and safety during sexual activity. Flowsheet Row CARDIAC REHAB PHASE II EXERCISE from 06/14/2022 in [place]  Date 06/07/22  Educator DF  Instruction Review Code 1- Verbalizes Understanding       Smoking Cessation / COPD -Discuss different methods to quit smoking, the health benefits of quitting smoking, and the definition of COPD. Flowsheet Row CARDIAC REHAB PHASE II EXERCISE from 06/14/2022 in [place]  Date 06/14/22  Educator DF  Instruction Review Code 2- Demonstrated Understanding       Nutrition I: Fats -Discuss the types of cholesterol, what cholesterol does to the heart, and how cholesterol levels can be controlled.   Nutrition II: Labels -Discuss the different components of food labels and how to read food label   Heart Parts/Heart Disease and PAD -Discuss the anatomy of the heart, the pathway of blood circulation through the heart, and these are affected by heart disease.   Stress I: Signs and Symptoms -Discuss the causes of stress, how stress may lead to anxiety and depression, and ways to limit stress.   Stress II: Relaxation -Discuss different types of relaxation techniques to limit stress.   Warning Signs of Stroke / TIA -Discuss definition of a stroke, what the signs and symptoms are of a stroke, and how to identify when someone is having stroke.   Knowledge Questionnaire Score:  Knowledge Questionnaire Score - 04/27/22 0851        Knowledge Questionnaire Score   Pre Score 18/24             Core Components/Risk Factors/Patient Goals at Admission:  Personal Goals and Risk Factors at Admission - 04/27/22 0855       Core Components/Risk Factors/Patient Goals on Admission   Diabetes Yes    Intervention Provide education about signs/symptoms and action to take for hypo/hyperglycemia.;Provide education about proper nutrition, including hydration, and aerobic/resistive exercise prescription along with prescribed medications to achieve blood glucose in normal ranges: Fasting glucose 65-99 mg/dL    Expected Outcomes Short Term: Participant verbalizes understanding of the signs/symptoms and immediate care of hyper/hypoglycemia, proper foot care and importance of medication, aerobic/resistive exercise and nutrition plan for blood glucose control.;Long Term: Attainment of HbA1C < 7%.    Hypertension Yes    Intervention Provide education on lifestyle modifcations including regular physical activity/exercise, weight management, moderate sodium restriction and increased consumption of fresh fruit, vegetables, and low fat dairy, alcohol moderation, and smoking cessation.;Monitor prescription use compliance.  Expected Outcomes Short Term: Continued assessment and intervention until BP is < 140/42m HG in hypertensive participants. < 130/852mHG in hypertensive participants with diabetes, heart failure or chronic kidney disease.;Long Term: Maintenance of blood pressure at goal levels.    Lipids Yes    Intervention Provide education and support for participant on nutrition & aerobic/resistive exercise along with prescribed medications to achieve LDL '70mg'$ , HDL >'40mg'$ .    Expected Outcomes Short Term: Participant states understanding of desired cholesterol values and is compliant with medications prescribed. Participant is following exercise prescription and nutrition guidelines.;Long Term: Cholesterol controlled with medications as  prescribed, with individualized exercise RX and with personalized nutrition plan. Value goals: LDL < '70mg'$ , HDL > 40 mg.    Personal Goal Other Yes    Personal Goal Increase Strength and stamina    Intervention Attend CR and exercise at home at least two days per week    Expected Outcomes Pt will meet stated goals.             Core Components/Risk Factors/Patient Goals Review:   Goals and Risk Factor Review     Row Name 05/17/22 0940 06/12/22 1359           Core Components/Risk Factors/Patient Goals Review   Personal Goals Review Diabetes;Lipids;Hypertension;Other Diabetes;Lipids;Hypertension;Other      Review Patient was referred to CR with Atherosclerotic heart disease of native coronary artery without angina [place] and is participating in the program for risk modification. He has completed 6 sessions. His current weight is 190.5 lbs down 0.5 lbs from his initial visit. He is doing well in the program with consistent attendance and progressions. His DM is managed with Metformin and Jardiance. His last A1C on file was 04/06/22 at 9.0% up from July at 7.2%. [facility] and he discontinued HCTZ for complaints of dizziness. His blood pressures are well controlled. His personal goals for the program are to increase his strength and stamina. We will continue to monitor his progress as he works towards meeting these goals. Patient has completed 18 sessions. His current weight is 184.4 lbs down 6.1 lbs since last 30 day review. He continues to do well in the program with consistent attendance and progressions. His DM continues to be managed with Metformin and Jardiance. His last A1C on file was 04/06/22 at 9.0% up from July at 7.2%. [facility] his HR greater than 100 at rest. [facility] cardiology called back starting it was okay to allow him to exercise as long as his HR was less than 110 and his target exercise  HR could be increased to 130. Changes made in exercise prescription. His blood pressures continue to be well controlled. His personal goals for the program are to increase his strength and stamina. We will continue to monitor his progress as he works towards meeting these goals.      Expected Outcomes Patient will complete the program meeting both personal and program goals. Patient will complete the program meeting both personal and program goals.               Core Components/Risk Factors/Patient Goals at Discharge (Final Review):   Goals and Risk Factor Review - 06/12/22 1359       Core Components/Risk Factors/Patient Goals Review   Personal Goals Review Diabetes;Lipids;Hypertension;Other    Review Patient has completed 18 sessions. His current weight is 184.4 lbs down 6.1 lbs since  last 30 day review. He continues to do well in the program with consistent attendance and progressions. His DM continues to be managed with Metformin and Jardiance. His last A1C on file was 04/06/22 at 9.0% up from July at 7.2%. [facility] regarding his HR greater than 100 at rest. Jobe Marker, [place] with cardiology called back starting it was okay to allow him to exercise as long as his HR was less than 110 and his target exercise HR could be increased to 130. Changes made in exercise prescription. His blood pressures continue to be well controlled. His personal goals for the program are to increase his strength and stamina. We will continue to monitor his progress as he works towards meeting these goals.    Expected Outcomes Patient will complete the program meeting both personal and program goals.             ITP Comments:  ITP Comments     Row Name 05/03/22 0949 05/26/22 1118         ITP Comments Pt's heart rate was 10-15 beats above his target today with light effort. When asked, pt admits that he did not take his medications this morning. I instructed the pt to always take his cardiac  medications before attending rehab. He voiced understanding. [name], FNP from [facility] called regarding patient's resting HR >100 on 2 occasions and targart heart rate guildlines. She said she did not want to increase his Metoprolol due to stable blood pressure. She said 130 would be a safe target exercise HR as long as he had no anginal or other symptoms and his blood pressure was stable. She said he did run greater than 100 resting in the clinic and felt that as long as his resting heart rate was not above 110, he could exercise. We will continue to monitor.               Comments: ITP REVIEW Pt is making expected progress toward Cardiac Rehab goals after completing 22 sessions. Recommend continued exercise, life style modification, education, and increased stamina and strength.

## 2022-06-21 NOTE — Progress Notes (Signed)
Daily Session Note  Patient Details  Name: Samuel Holloway. MRN: [id number] Date of Birth: 1940-06-04 Referring Provider:   Flowsheet Row CARDIAC REHAB PHASE II ORIENTATION from 04/27/2022 in [place]  Referring Provider [name], FNP       Encounter Date: 06/21/2022  Check In:  Session Check In - 06/21/22 [id number]       Check-In   Supervising physician immediately available to respond to emergencies [facility] MD immediately available    Physician(s) Dr Harl Bowie    Location [facility]    Staff Present Leana Roe, BS, Exercise Physiologist;Dalton Sherrie George, MS, ACSM-CEP;Madelyn Flavors, RN, BSN    Virtual Visit No    Medication changes reported     No    Fall or balance concerns reported    Yes    Comments He has had several falls this year. He reports that his legs give out. He walks with a cane.    Tobacco Cessation No Change    Warm-up and Cool-down Performed as group-led instruction    Resistance Training Performed Yes    VAD Patient? No    PAD/SET Patient? No      Pain Assessment   Currently in Pain? No/denies    Multiple Pain Sites No             Capillary Blood Glucose: No results found for this or any previous visit (from the past 24 hour(s)).    Social History   Tobacco Use  Smoking Status Not on file  Smokeless Tobacco Not on file    Goals Met:  Independence with exercise equipment Exercise tolerated well No report of concerns or symptoms today Strength training completed today  Goals Unmet:  Not Applicable  Comments: Checkout at 1030.   Dr. Carlyle Dolly is Medical Director for [facility] Cardiac Rehab

## 2022-06-23 ENCOUNTER — Encounter (HOSPITAL_COMMUNITY)
Admission: RE | Admit: 2022-06-23 | Discharge: 2022-06-23 | Disposition: A | Discharge status: Home / Self Care | Payer: No Typology Code available for payment source | Source: Ambulatory Visit | Attending: Nurse Practitioner | Admitting: Nurse Practitioner

## 2022-06-23 ENCOUNTER — Ambulatory Visit (HOSPITAL_COMMUNITY): Payer: No Typology Code available for payment source

## 2022-06-23 DIAGNOSIS — I2510 Atherosclerotic heart disease of native coronary artery without angina pectoris: Secondary | ICD-10-CM | POA: Diagnosis present

## 2022-06-23 NOTE — Progress Notes (Signed)
Daily Session Note  Patient Details  Name: Samuel Holloway. MRN: [id number] Date of Birth: June 14, 1940 Referring Provider:   Flowsheet Row CARDIAC REHAB PHASE II ORIENTATION from 04/27/2022 in [place]  Referring Provider [name], FNP       Encounter Date: 06/23/2022  Check In:  Session Check In - 06/23/22 [id number]       Check-In   Supervising physician immediately available to respond to emergencies [facility] MD immediately available    Physician(s) Dr Harl Bowie    Location [facility]    Staff Present Leana Roe, BS, Exercise Physiologist;Dalton Sherrie George, MS, ACSM-CEP;Madelyn Flavors, RN, BSN    Virtual Visit No    Medication changes reported     No    Comments D/c lisinopril    Fall or balance concerns reported    Yes    Comments He has had several falls this year. He reports that his legs give out. He walks with a cane.    Tobacco Cessation No Change    Warm-up and Cool-down Performed as group-led instruction    Resistance Training Performed Yes    VAD Patient? No    PAD/SET Patient? No      Pain Assessment   Currently in Pain? No/denies    Multiple Pain Sites No             Capillary Blood Glucose: No results found for this or any previous visit (from the past 24 hour(s)).    Social History   Tobacco Use  Smoking Status Not on file  Smokeless Tobacco Not on file    Goals Met:  Independence with exercise equipment Exercise tolerated well No report of concerns or symptoms today Strength training completed today  Goals Unmet:  Not Applicable  Comments: checkout at 1030.   Dr. Carlyle Dolly is Medical Director for [facility] Cardiac Rehab

## 2022-06-26 ENCOUNTER — Ambulatory Visit (HOSPITAL_COMMUNITY): Payer: No Typology Code available for payment source

## 2022-06-26 ENCOUNTER — Encounter (HOSPITAL_COMMUNITY)
Admission: RE | Admit: 2022-06-26 | Discharge: 2022-06-26 | Disposition: A | Discharge status: Home / Self Care | Payer: No Typology Code available for payment source | Source: Ambulatory Visit | Attending: Nurse Practitioner | Admitting: Nurse Practitioner

## 2022-06-26 DIAGNOSIS — I2510 Atherosclerotic heart disease of native coronary artery without angina pectoris: Secondary | ICD-10-CM

## 2022-06-26 NOTE — Progress Notes (Signed)
Daily Session Note  Patient Details  Name: Samuel Holloway. MRN: [id number] Date of Birth: [birth year] Referring Provider:   Flowsheet Row CARDIAC REHAB PHASE II ORIENTATION from 04/27/2022 in [place]  Referring Provider [name], FNP       Encounter Date: 06/26/2022  Check In:  Session Check In - 06/26/22 0908       Check-In   Supervising physician immediately available to respond to emergencies [facility] MD immediately available    Physician(s) Dr Harl Bowie    Location [facility]    Staff Present Leana Roe, BS, Exercise Physiologist;Mercy Leppla Hassell Done, RN, BSN    Virtual Visit No    Medication changes reported     No    Comments D/c lisinopril    Fall or balance concerns reported    Yes    Comments He has had several falls this year. He reports that his legs give out. He walks with a cane.    Tobacco Cessation No Change    Warm-up and Cool-down Performed as group-led instruction    Resistance Training Performed Yes    VAD Patient? No    PAD/SET Patient? No      Pain Assessment   Currently in Pain? No/denies    Multiple Pain Sites No             Capillary Blood Glucose: No results found for this or any previous visit (from the past 24 hour(s)).    Social History   Tobacco Use  Smoking Status Not on file  Smokeless Tobacco Not on file    Goals Met:  Independence with exercise equipment Exercise tolerated well No report of concerns or symptoms today Strength training completed today  Goals Unmet:  Not Applicable  Comments: Checkout at 1030.   Dr. Carlyle Dolly is Medical Director for [facility] Cardiac Rehab

## 2022-06-28 ENCOUNTER — Encounter (HOSPITAL_COMMUNITY)
Admission: RE | Admit: 2022-06-28 | Discharge: 2022-06-28 | Disposition: A | Discharge status: Home / Self Care | Payer: No Typology Code available for payment source | Source: Ambulatory Visit | Attending: Nurse Practitioner | Admitting: Nurse Practitioner

## 2022-06-28 ENCOUNTER — Ambulatory Visit (HOSPITAL_COMMUNITY): Payer: No Typology Code available for payment source

## 2022-06-28 DIAGNOSIS — I2510 Atherosclerotic heart disease of native coronary artery without angina pectoris: Secondary | ICD-10-CM

## 2022-06-28 LAB — GLUCOSE, CAPILLARY: Glucose-Capillary: 174 mg/dL — ABNORMAL HIGH (ref 70–99)

## 2022-06-28 NOTE — Progress Notes (Signed)
Daily Session Note  Patient Details  Name: Samuel Holloway. MRN: [id number] Date of Birth: 05/07/1940 Referring Provider:   Flowsheet Row CARDIAC REHAB PHASE II ORIENTATION from 04/27/2022 in [place]  Referring Provider [name], FNP       Encounter Date: 06/28/2022  Check In:  Session Check In - 06/28/22 0930       Check-In   Supervising physician immediately available to respond to emergencies [facility] MD immediately available    Physician(s) Dr Harl Bowie    Location [facility]    Staff Present Hoy Register MHA, MS, ACSM-CEP;[employer] BSN, RN;Heather Mel Almond, [place], Exercise Physiologist    Virtual Visit No    Medication changes reported     No    Fall or balance concerns reported    Yes    Comments He has had several falls this year. He reports that his legs give out. He walks with a cane.    Tobacco Cessation No Change    Warm-up and Cool-down Performed as group-led instruction    Resistance Training Performed Yes    VAD Patient? No    PAD/SET Patient? No      Pain Assessment   Currently in Pain? No/denies    Multiple Pain Sites No             Capillary Blood Glucose: No results found for this or any previous visit (from the past 24 hour(s)).    Social History   Tobacco Use  Smoking Status Not on file  Smokeless Tobacco Not on file    Goals Met:  Independence with exercise equipment Exercise tolerated well No report of concerns or symptoms today Strength training completed today  Goals Unmet:  Not Applicable  Comments: checkout time is 1030   Dr. Carlyle Dolly is Medical Director for [place]

## 2022-06-30 ENCOUNTER — Ambulatory Visit (HOSPITAL_COMMUNITY): Payer: No Typology Code available for payment source

## 2022-06-30 ENCOUNTER — Encounter (HOSPITAL_COMMUNITY)
Admission: RE | Admit: 2022-06-30 | Discharge: 2022-06-30 | Disposition: A | Discharge status: Home / Self Care | Payer: No Typology Code available for payment source | Source: Ambulatory Visit | Attending: Nurse Practitioner | Admitting: Nurse Practitioner

## 2022-06-30 DIAGNOSIS — I2510 Atherosclerotic heart disease of native coronary artery without angina pectoris: Secondary | ICD-10-CM | POA: Diagnosis not present

## 2022-06-30 NOTE — Progress Notes (Signed)
Daily Session Note  Patient Details  Name: Samuel Holloway. MRN: [id number] Date of Birth: 01/10/1941 Referring Provider:   Flowsheet Row CARDIAC REHAB PHASE II ORIENTATION from 04/27/2022 in [place]  Referring Provider [name], FNP       Encounter Date: 06/30/2022  Check In:  Session Check In - 06/30/22 [id number]       Check-In   Supervising physician immediately available to respond to emergencies [facility] MD immediately available    Physician(s) Dr Dellia Cloud    Location [facility]    Staff Present Leana Roe, BS, Exercise Physiologist;Debra Wynetta Emery, RN, Joanette Gula, RN, BSN    Virtual Visit No    Medication changes reported     No    Comments D/c lisinopril    Fall or balance concerns reported    Yes    Tobacco Cessation No Change    Warm-up and Cool-down Performed as group-led instruction    Resistance Training Performed Yes    VAD Patient? No    PAD/SET Patient? No      Pain Assessment   Currently in Pain? No/denies    Multiple Pain Sites No             Capillary Blood Glucose: No results found for this or any previous visit (from the past 24 hour(s)).    Social History   Tobacco Use  Smoking Status Not on file  Smokeless Tobacco Not on file    Goals Met:  Independence with exercise equipment Exercise tolerated well No report of concerns or symptoms today Strength training completed today  Goals Unmet:  Not Applicable  Comments: Checkout at 1030.   Dr. Carlyle Dolly is Medical Director for [facility] Cardiac Rehab

## 2022-07-03 ENCOUNTER — Ambulatory Visit (HOSPITAL_COMMUNITY): Payer: No Typology Code available for payment source

## 2022-07-03 ENCOUNTER — Encounter (HOSPITAL_COMMUNITY)
Admission: RE | Admit: 2022-07-03 | Discharge: 2022-07-03 | Disposition: A | Discharge status: Home / Self Care | Payer: No Typology Code available for payment source | Source: Ambulatory Visit | Attending: Nurse Practitioner | Admitting: Nurse Practitioner

## 2022-07-03 VITALS — Wt 181.9 lb

## 2022-07-03 DIAGNOSIS — I2510 Atherosclerotic heart disease of native coronary artery without angina pectoris: Secondary | ICD-10-CM

## 2022-07-03 NOTE — Progress Notes (Signed)
Daily Session Note  Patient Details  Name: Samuel Holloway. MRN: [id number] Date of Birth: 1940/08/31 Referring Provider:   Flowsheet Row CARDIAC REHAB PHASE II ORIENTATION from 04/27/2022 in [place]  Referring Provider [name], FNP       Encounter Date: 07/03/2022  Check In:  Session Check In - 07/03/22 0924       Check-In   Supervising physician immediately available to respond to emergencies [facility] MD immediately available    Physician(s) Dr Dellia Cloud    Location [facility]    Staff Present Leana Roe, BS, Exercise Physiologist;Syed Zukas Hassell Done, RN, BSN;Dalton Sherrie George, MS, ACSM-CEP    Virtual Visit No    Medication changes reported     No    Comments D/c lisinopril    Fall or balance concerns reported    Yes    Comments He has had several falls this year. He reports that his legs give out. He walks with a cane.    Tobacco Cessation No Change    Warm-up and Cool-down Performed as group-led instruction    Resistance Training Performed Yes    VAD Patient? No    PAD/SET Patient? No      Pain Assessment   Currently in Pain? No/denies    Multiple Pain Sites No             Capillary Blood Glucose: No results found for this or any previous visit (from the past 24 hour(s)).    Social History   Tobacco Use  Smoking Status Not on file  Smokeless Tobacco Not on file    Goals Met:  Independence with exercise equipment Exercise tolerated well No report of concerns or symptoms today Strength training completed today  Goals Unmet:  Not Applicable  Comments: Checkout at 1030.   Dr. Carlyle Dolly is Medical Director for [facility] Cardiac Rehab

## 2022-07-05 ENCOUNTER — Encounter (HOSPITAL_COMMUNITY)
Admission: RE | Admit: 2022-07-05 | Discharge: 2022-07-05 | Disposition: A | Discharge status: Home / Self Care | Payer: No Typology Code available for payment source | Source: Ambulatory Visit | Attending: Nurse Practitioner | Admitting: Nurse Practitioner

## 2022-07-05 ENCOUNTER — Ambulatory Visit (HOSPITAL_COMMUNITY): Payer: No Typology Code available for payment source

## 2022-07-05 DIAGNOSIS — I2510 Atherosclerotic heart disease of native coronary artery without angina pectoris: Secondary | ICD-10-CM | POA: Diagnosis not present

## 2022-07-05 NOTE — Progress Notes (Signed)
Daily Session Note  Patient Details  Name: Samuel Holloway. MRN: [id number] Date of Birth: 1941/03/18 Referring Provider:   Flowsheet Row CARDIAC REHAB PHASE II ORIENTATION from 04/27/2022 in [place]  Referring Provider [name], FNP       Encounter Date: 07/05/2022  Check In:  Session Check In - 07/05/22 0922       Check-In   Supervising physician immediately available to respond to emergencies [facility] MD immediately available    Physician(s) Dr Dellia Cloud    Location [facility]    Staff Present Hoy Register MHA, MS, ACSM-CEP;[employer] BSN, RN;Heather Mel Almond, [place], Exercise Physiologist    Virtual Visit No    Medication changes reported     No    Fall or balance concerns reported    Yes    Comments He has had several falls this year. He reports that his legs give out. He walks with a cane.    Tobacco Cessation No Change    Warm-up and Cool-down Performed as group-led instruction    Resistance Training Performed Yes    VAD Patient? No    PAD/SET Patient? No      Pain Assessment   Currently in Pain? No/denies    Multiple Pain Sites No             Capillary Blood Glucose: No results found for this or any previous visit (from the past 24 hour(s)).    Social History   Tobacco Use  Smoking Status Not on file  Smokeless Tobacco Not on file    Goals Met:  Independence with exercise equipment Exercise tolerated well No report of concerns or symptoms today Strength training completed today  Goals Unmet:  Not Applicable  Comments: checkout time is 1030   Dr. Carlyle Dolly is Medical Director for [place]

## 2022-07-07 ENCOUNTER — Ambulatory Visit (HOSPITAL_COMMUNITY): Payer: No Typology Code available for payment source

## 2022-07-07 ENCOUNTER — Encounter (HOSPITAL_COMMUNITY)
Admission: RE | Admit: 2022-07-07 | Discharge: 2022-07-07 | Disposition: A | Discharge status: Home / Self Care | Payer: No Typology Code available for payment source | Source: Ambulatory Visit | Attending: Nurse Practitioner | Admitting: Nurse Practitioner

## 2022-07-07 DIAGNOSIS — I2510 Atherosclerotic heart disease of native coronary artery without angina pectoris: Secondary | ICD-10-CM

## 2022-07-07 NOTE — Progress Notes (Signed)
Daily Session Note  Patient Details  Name: Samuel Holloway. MRN: [id number] Date of Birth: 1940/05/10 Referring Provider:   Flowsheet Row CARDIAC REHAB PHASE II ORIENTATION from 04/27/2022 in [place]  Referring Provider [name], FNP       Encounter Date: 07/07/2022  Check In:  Session Check In - 07/07/22 0926       Check-In   Supervising physician immediately available to respond to emergencies [facility] MD immediately available    Physician(s) Dr Dellia Cloud    Location [facility]    Staff Present Hoy Register MHA, MS, ACSM-CEP;Orell Hurtado Wynetta Emery, RN, BSN;Heather Mel Almond, BS, Exercise Physiologist    Virtual Visit No    Medication changes reported     No    Fall or balance concerns reported    Yes    Comments He has had several falls this year. He reports that his legs give out. He walks with a cane.    Tobacco Cessation No Change    Warm-up and Cool-down Performed as group-led instruction    Resistance Training Performed Yes    VAD Patient? No    PAD/SET Patient? No      Pain Assessment   Currently in Pain? No/denies    Multiple Pain Sites No             Capillary Blood Glucose: No results found for this or any previous visit (from the past 24 hour(s)).    Social History   Tobacco Use  Smoking Status Not on file  Smokeless Tobacco Not on file    Goals Met:  Independence with exercise equipment Exercise tolerated well No report of concerns or symptoms today Strength training completed today  Goals Unmet:  Not Applicable  Comments: Check out 1030.   Dr. Carlyle Dolly is Medical Director for [facility]

## 2022-07-10 ENCOUNTER — Encounter (HOSPITAL_COMMUNITY)
Admission: RE | Admit: 2022-07-10 | Discharge: 2022-07-10 | Disposition: A | Discharge status: Home / Self Care | Payer: No Typology Code available for payment source | Source: Ambulatory Visit | Attending: Nurse Practitioner | Admitting: Nurse Practitioner

## 2022-07-10 ENCOUNTER — Ambulatory Visit (HOSPITAL_COMMUNITY): Payer: No Typology Code available for payment source

## 2022-07-10 DIAGNOSIS — I2510 Atherosclerotic heart disease of native coronary artery without angina pectoris: Secondary | ICD-10-CM

## 2022-07-10 NOTE — Progress Notes (Signed)
Daily Session Note  Patient Details  Name: Samuel Holloway. MRN: [id number] Date of Birth: 05/20/1940 Referring Provider:   Flowsheet Row CARDIAC REHAB PHASE II ORIENTATION from 04/27/2022 in [place]  Referring Provider [name], FNP       Encounter Date: 07/10/2022  Check In:  Session Check In - 07/10/22 0925       Check-In   Supervising physician immediately available to respond to emergencies [facility] MD immediately available    Physician(s) Dr Dellia Cloud    Location [facility]    Staff Present Hoy Register MHA, MS, ACSM-CEP;Leana Roe, BS, Exercise Physiologist;Everlynn Sagun Hassell Done, RN, BSN    Virtual Visit No    Medication changes reported     No    Comments D/c lisinopril    Fall or balance concerns reported    Yes    Comments He has had several falls this year. He reports that his legs give out. He walks with a cane.    Tobacco Cessation No Change    Warm-up and Cool-down Performed as group-led instruction    Resistance Training Performed Yes    VAD Patient? No    PAD/SET Patient? No      Pain Assessment   Currently in Pain? No/denies    Multiple Pain Sites No             Capillary Blood Glucose: No results found for this or any previous visit (from the past 24 hour(s)).    Social History   Tobacco Use  Smoking Status Not on file  Smokeless Tobacco Not on file    Goals Met:  Independence with exercise equipment Exercise tolerated well No report of concerns or symptoms today Strength training completed today  Goals Unmet:  Not Applicable  Comments: Checkout at 1030.   Dr. Carlyle Dolly is Medical Director for [facility] Card[facility] Rehab

## 2022-07-12 ENCOUNTER — Encounter (HOSPITAL_COMMUNITY)
Admission: RE | Admit: 2022-07-12 | Discharge: 2022-07-12 | Disposition: A | Discharge status: Home / Self Care | Payer: No Typology Code available for payment source | Source: Ambulatory Visit | Attending: Nurse Practitioner | Admitting: Nurse Practitioner

## 2022-07-12 ENCOUNTER — Ambulatory Visit (HOSPITAL_COMMUNITY): Payer: No Typology Code available for payment source

## 2022-07-12 DIAGNOSIS — I2510 Atherosclerotic heart disease of native coronary artery without angina pectoris: Secondary | ICD-10-CM

## 2022-07-12 NOTE — Progress Notes (Signed)
Daily Session Note  Patient Details  Name: Samuel Holloway. MRN: [id number] Date of Birth: 03/22/1941 Referring Provider:   Flowsheet Row CARDIAC REHAB PHASE II ORIENTATION from 04/27/2022 in [place]  Referring Provider [name], FNP       Encounter Date: 07/12/2022  Check In:  Session Check In - 07/12/22 0930       Check-In   Supervising physician immediately available to respond to emergencies [facility] MD immediately available    Physician(s) Dr. Gasper Sells    Location [facility]    Staff Present Hoy Register MHA, MS, ACSM-CEP;Leana Roe, BS, Exercise Physiologist    Virtual Visit No    Medication changes reported     No    Fall or balance concerns reported    Yes    Comments He has had several falls this year. He reports that his legs give out. He walks with a cane.    Tobacco Cessation No Change    Warm-up and Cool-down Performed as group-led instruction    Resistance Training Performed Yes    VAD Patient? No    PAD/SET Patient? No      Pain Assessment   Currently in Pain? No/denies    Multiple Pain Sites No             Capillary Blood Glucose: No results found for this or any previous visit (from the past 24 hour(s)).    Social History   Tobacco Use  Smoking Status Not on file  Smokeless Tobacco Not on file    Goals Met:  Independence with exercise equipment Exercise tolerated well No report of concerns or symptoms today Strength training completed today  Goals Unmet:  Not Applicable  Comments: check out 1030   Dr. Carlyle Dolly is Medical Director for [facility]

## 2022-07-14 ENCOUNTER — Ambulatory Visit (HOSPITAL_COMMUNITY): Payer: No Typology Code available for payment source

## 2022-07-14 ENCOUNTER — Encounter (HOSPITAL_COMMUNITY)
Admission: RE | Admit: 2022-07-14 | Discharge: 2022-07-14 | Disposition: A | Discharge status: Home / Self Care | Payer: No Typology Code available for payment source | Source: Ambulatory Visit | Attending: Nurse Practitioner | Admitting: Nurse Practitioner

## 2022-07-14 DIAGNOSIS — I2510 Atherosclerotic heart disease of native coronary artery without angina pectoris: Secondary | ICD-10-CM | POA: Diagnosis not present

## 2022-07-14 NOTE — Progress Notes (Signed)
Daily Session Note  Patient Details  Name: Samuel Holloway. MRN: [id number] Date of Birth: 1940/12/13 Referring Provider:   Flowsheet Row CARDIAC REHAB PHASE II ORIENTATION from 04/27/2022 in [place]  Referring Provider [name], FNP       Encounter Date: 07/14/2022  Check In:  Session Check In - 07/14/22 [id number]       Check-In   Supervising physician immediately available to respond to emergencies [facility] MD immediately available    Physician(s) Dr Harl Bowie    Location [facility]    Staff Present Leana Roe, BS, Exercise Physiologist;Debra Wynetta Emery, RN, Joanette Gula, RN, BSN    Virtual Visit No    Medication changes reported     No    Comments D/c lisinopril    Fall or balance concerns reported    Yes    Comments He has had several falls this year. He reports that his legs give out. He walks with a cane.    Tobacco Cessation No Change    Warm-up and Cool-down Performed as group-led instruction    Resistance Training Performed Yes    VAD Patient? No    PAD/SET Patient? No      Pain Assessment   Currently in Pain? No/denies    Multiple Pain Sites No             Capillary Blood Glucose: No results found for this or any previous visit (from the past 24 hour(s)).    Social History   Tobacco Use  Smoking Status Not on file  Smokeless Tobacco Not on file    Goals Met:  Independence with exercise equipment Exercise tolerated well No report of concerns or symptoms today Strength training completed today  Goals Unmet:  Not Applicable  Comments: Checkout at 1030.   Dr. Carlyle Dolly is Medical Director for [facility] Cardiac Rehab

## 2022-07-17 ENCOUNTER — Ambulatory Visit (HOSPITAL_COMMUNITY): Payer: No Typology Code available for payment source

## 2022-07-17 ENCOUNTER — Encounter (HOSPITAL_COMMUNITY)
Admission: RE | Admit: 2022-07-17 | Discharge: 2022-07-17 | Disposition: A | Discharge status: Home / Self Care | Payer: No Typology Code available for payment source | Source: Ambulatory Visit | Attending: Nurse Practitioner | Admitting: Nurse Practitioner

## 2022-07-17 VITALS — Ht 71.0 in | Wt 185.6 lb

## 2022-07-17 DIAGNOSIS — I2510 Atherosclerotic heart disease of native coronary artery without angina pectoris: Secondary | ICD-10-CM

## 2022-07-17 NOTE — Progress Notes (Signed)
Daily Session Note  Patient Details  Name: Samuel Holloway. MRN: [id number] Date of Birth: [birth year] Referring Provider:   Flowsheet Row CARDIAC REHAB PHASE II ORIENTATION from 04/27/2022 in [place]  Referring Provider [name], FNP       Encounter Date: 07/17/2022  Check In:  Session Check In - 07/17/22 0929       Check-In   Supervising physician immediately available to respond to emergencies [facility] MD immediately available    Physician(s) Dr Domenic Polite    Location [facility]    Staff Present Leana Roe, BS, Exercise Physiologist;Yuta Cipollone Hassell Done, RN, BSN;Dalton Sherrie George, MS, ACSM-CEP    Virtual Visit No    Medication changes reported     No    Comments D/c lisinopril    Fall or balance concerns reported    Yes    Comments He has had several falls this year. He reports that his legs give out. He walks with a cane.    Tobacco Cessation No Change    Warm-up and Cool-down Performed as group-led instruction    Resistance Training Performed Yes    VAD Patient? No    PAD/SET Patient? No      Pain Assessment   Currently in Pain? No/denies    Multiple Pain Sites No             Capillary Blood Glucose: No results found for this or any previous visit (from the past 24 hour(s)).    Social History   Tobacco Use  Smoking Status Not on file  Smokeless Tobacco Not on file    Goals Met:  Independence with exercise equipment Exercise tolerated well No report of concerns or symptoms today Strength training completed today  Goals Unmet:  Not Applicable  Comments: Checkout at 1030.   Dr. Carlyle Dolly is Medical Director for [facility]

## 2022-07-19 ENCOUNTER — Ambulatory Visit (HOSPITAL_COMMUNITY): Payer: No Typology Code available for payment source

## 2022-07-19 ENCOUNTER — Encounter (HOSPITAL_COMMUNITY): Payer: No Typology Code available for payment source

## 2022-07-21 ENCOUNTER — Ambulatory Visit (HOSPITAL_COMMUNITY): Payer: No Typology Code available for payment source

## 2022-07-21 ENCOUNTER — Encounter (HOSPITAL_COMMUNITY): Payer: No Typology Code available for payment source

## 2022-07-24 NOTE — Progress Notes (Signed)
Discharge Progress Report  Patient Details  Name: Samuel Holloway. MRN: [id number] Date of Birth: [birth year] Referring Provider:   Flowsheet Row CARDIAC REHAB PHASE II ORIENTATION from 04/27/2022 in [place]  Referring Provider [name], FNP        Number of Visits: 34  Reason for Discharge:  Patient reached a stable level of exercise. Patient independent in their exercise. Patient has met program and personal goals.  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 without angina pectoris  ADL UCSD:   Initial Exercise Prescription:  Initial Exercise Prescription - 04/27/22 1000       Date of Initial Exercise RX and Referring Provider   Date 04/27/22    Referring Provider [name], FNP    Expected Discharge Date 07/21/22      NuStep   Level 1    SPM 80    Minutes 22      Arm Ergometer   Level 1    RPM 60    Minutes 17      Prescription Details   Frequency (times per week) 3    Duration Progress to 30 minutes of continuous aerobic without signs/symptoms of physical distress      Intensity   THRR 40-80% of Max Heartrate 56-112    Ratings of Perceived Exertion 11-13    Perceived Dyspnea 0-4      Resistance Training   Training Prescription Yes    Weight 4    Reps 10-15             Discharge Exercise Prescription (Final Exercise Prescription Changes):  Exercise Prescription Changes - 07/03/22 1000       Response to Exercise   Blood Pressure (Admit) 108/58    Blood Pressure (Exercise) 164/82    Blood Pressure (Exit) 104/54    Heart Rate (Admit) 82 bpm    Heart Rate (Exercise) 120 bpm    Heart Rate (Exit) 91 bpm    Rating of Perceived Exertion (Exercise) 13    Duration Continue with 30 min of aerobic exercise without signs/symptoms of physical distress.    Intensity THRR  unchanged      Progression   Progression Continue to progress workloads to maintain intensity without signs/symptoms of physical distress.      Resistance Training   Training Prescription Yes    Weight 5    Reps 10-15    Time 10 Minutes      NuStep   Level 3    SPM 117    Minutes 22    METs 3.46      Arm Ergometer   Level 3    RPM 60    Minutes 17    METs 3.03             Functional Capacity:  6 Minute Walk     Row Name 04/27/22 1020 07/17/22 1012       6 Minute Walk   Phase Initial Discharge    Distance 1050 feet 1400 feet    Distance Feet Change -- 350 ft    Walk Time 6 minutes 6 minutes    # of Rest Breaks 0 0    MPH 1.99 2.65    METS 1.84 2.89    RPE 9 12    VO2 Peak 6.45 10.14    Symptoms  No No    Resting HR 73 bpm 80 bpm    Resting BP 100/52 132/80    Resting Oxygen Saturation  99 % 98 %    Exercise Oxygen Saturation  during 6 min walk 99 % 100 %    Max Ex. HR 86 bpm 108 bpm    Max Ex. BP 122/60 146/78    2 Minute Post BP 102/60 130/80             Psychological, QOL, Others - Outcomes: PHQ 2/9:    07/24/2022    1:37 PM 04/27/2022    8:36 AM  Depression screen PHQ 2/9  Decreased Interest 0 0  Down, Depressed, Hopeless 0 0  PHQ - 2 Score 0 0  Altered sleeping 2 0  Tired, decreased energy 0 1  Change in appetite 0 1  Feeling bad or failure about yourself  0 0  Trouble concentrating 0 0  Moving slowly or fidgety/restless 0 0  Suicidal thoughts 0 0  PHQ-9 Score 2 2  Difficult doing work/chores Not difficult at all Somewhat difficult    Quality of Life:  Quality of Life - 07/17/22 1014       Quality of Life Scores   Health/Function Pre 22.15 %    Health/Function Post 5.3 %    Health/Function % Change -76.07 %    Socioeconomic Pre 22.5 %    Socioeconomic Post 4.8 %    Socioeconomic % Change  -78.67 %    Psych/Spiritual Pre 27.43 %    Psych/Spiritual Post 1.71 %    Psych/Spiritual % Change -93.77 %    Family Pre 19.2 %     Family Post 3.6 %    Family % Change -81.25 %    GLOBAL Pre 22.97 %    GLOBAL Post 4.17 %    GLOBAL % Change -81.85 %             Personal Goals: Goals established at orientation with interventions provided to work toward goal.  Personal Goals and Risk Factors at Admission - 04/27/22 0855       Core Components/Risk Factors/Patient Goals on Admission   Diabetes Yes    Intervention Provide education about signs/symptoms and action to take for hypo/hyperglycemia.;Provide education about proper nutrition, including hydration, and aerobic/resistive exercise prescription along with prescribed medications to achieve blood glucose in normal ranges: Fasting glucose 65-99 mg/dL    Expected Outcomes Short Term: Participant verbalizes understanding of the signs/symptoms and immediate care of hyper/hypoglycemia, proper foot care and importance of medication, aerobic/resistive exercise and nutrition plan for blood glucose control.;Long Term: Attainment of HbA1C < 7%.    Hypertension Yes    Intervention Provide education on lifestyle modifcations including regular physical activity/exercise, weight management, moderate sodium restriction and increased consumption of fresh fruit, vegetables, and low fat dairy, alcohol moderation, and smoking cessation.;Monitor prescription use compliance.    Expected Outcomes Short Term: Continued assessment and intervention until BP is < 140/67mm HG in hypertensive participants. < 130/35mm HG in hypertensive participants with diabetes, heart failure or chronic kidney disease.;Long Term: Maintenance of blood pressure at goal levels.    Lipids Yes    Intervention Provide education and support for participant on nutrition & aerobic/resistive exercise along with prescribed medications to achieve LDL 70mg , HDL >40mg .    Expected Outcomes Short Term: Participant states understanding of desired cholesterol values and is compliant with medications prescribed. Participant is  following exercise prescription and nutrition guidelines.;Long Term: Cholesterol controlled with medications  as prescribed, with individualized exercise RX and with personalized nutrition plan. Value goals: LDL < 70mg , HDL > 40 mg.    Personal Goal Other Yes    Personal Goal Increase Strength and stamina    Intervention Attend CR and exercise at home at least two days per week    Expected Outcomes Pt will meet stated goals.              Personal Goals Discharge:  Goals and Risk Factor Review     Row Name 05/17/22 0940 06/12/22 1359 07/24/22 1348         Core Components/Risk Factors/Patient Goals Review   Personal Goals Review Diabetes;Lipids;Hypertension;Other Diabetes;Lipids;Hypertension;Other Diabetes;Lipids;Hypertension;Other     Review Patient was referred to CR with Atherosclerotic heart disease of native coronary artery without angina from the [place]. He has multiple risk factors for CAD and is participating in the program for risk modification. He has completed 6 sessions. His current weight is 190.5 lbs down 0.5 lbs from his initial visit. He is doing well in the program with consistent attendance and progressions. His DM is managed with Metformin and Jardiance. His last A1C on file was 04/06/22 at 9.0% up from July at 7.2%. He saw MD at [facility] 05/05/12 and he discontinued HCTZ for complaints of dizziness. His blood pressures are well controlled. His personal goals for the program are to increase his strength and stamina. We will continue to monitor his progress as he works towards meeting these goals. Patient has completed 18 sessions. His current weight is 184.4 lbs down 6.1 lbs since last 30 day review. He continues to do well in the program with consistent attendance and progressions. His DM continues to be managed with Metformin and Jardiance. His last A1C on file was 04/06/22 at 9.0% up from July at 7.2%. [facility]2 regarding his HR greater than 100 at rest. Jobe Marker, [place]  with cardiology called back starting it was okay to allow him to exercise as long as his HR was less than 110 and his target exercise HR could be increased to 130. Changes made in exercise prescription. His blood pressures continue to be well controlled. His personal goals for the program are to increase his strength and stamina. We will continue to monitor his progress as he works towards meeting these goals. Pt graduated from CR after 34 sessions. He lost 5.2 lbs while he was in teh program. [facility], and he continues to take Metformin and Jardiance. His last A1C on file was 9.0% on 04/06/2022. [name], FNP, his referring provider from the [facility] contacted staff on 2/2 and stated that the patient was okay to have a resting heart rate up to 110 and she also increased his target exercise heart rate to 130. His blood pressures were WNLs while he was in the program, but he did have a few low readings post exercise. Pt reports that he was able to reach his goal of increasing his strength and stamina. He was able to improve his walk test distance by 33.33%.     Expected Outcomes Patient will complete the program meeting both personal and program goals. Patient will complete the program meeting both personal and program goals. Pt will continue to work towards their personal goals post discharge.              Exercise Goals and Review:  Exercise Goals     Row Name 04/27/22 1023 05/22/22 1429 06/19/22 1133  Exercise Goals   Increase Physical Activity Yes Yes Yes     Intervention Provide advice, education, support and counseling about physical activity/exercise needs.;Develop an individualized exercise prescription for aerobic and resistive training based on initial evaluation findings, risk stratification, comorbidities and participant's personal goals. Provide advice, education, support and counseling about physical activity/exercise needs.;Develop an individualized  exercise prescription for aerobic and resistive training based on initial evaluation findings, risk stratification, comorbidities and participant's personal goals. Provide advice, education, support and counseling about physical activity/exercise needs.;Develop an individualized exercise prescription for aerobic and resistive training based on initial evaluation findings, risk stratification, comorbidities and participant's personal goals.     Expected Outcomes Short Term: Attend rehab on a regular basis to increase amount of physical activity.;Long Term: Add in home exercise to make exercise part of routine and to increase amount of physical activity.;Long Term: Exercising regularly at least 3-5 days a week. Short Term: Attend rehab on a regular basis to increase amount of physical activity.;Long Term: Add in home exercise to make exercise part of routine and to increase amount of physical activity.;Long Term: Exercising regularly at least 3-5 days a week. Short Term: Attend rehab on a regular basis to increase amount of physical activity.;Long Term: Add in home exercise to make exercise part of routine and to increase amount of physical activity.;Long Term: Exercising regularly at least 3-5 days a week.     Increase Strength and Stamina Yes Yes Yes     Intervention Provide advice, education, support and counseling about physical activity/exercise needs.;Develop an individualized exercise prescription for aerobic and resistive training based on initial evaluation findings, risk stratification, comorbidities and participant's personal goals. Provide advice, education, support and counseling about physical activity/exercise needs.;Develop an individualized exercise prescription for aerobic and resistive training based on initial evaluation findings, risk stratification, comorbidities and participant's personal goals. Provide advice, education, support and counseling about physical activity/exercise needs.;Develop  an individualized exercise prescription for aerobic and resistive training based on initial evaluation findings, risk stratification, comorbidities and participant's personal goals.     Expected Outcomes Short Term: Increase workloads from initial exercise prescription for resistance, speed, and METs.;Short Term: Perform resistance training exercises routinely during rehab and add in resistance training at home;Long Term: Improve cardiorespiratory fitness, muscular endurance and strength as measured by increased METs and functional capacity (6MWT) Short Term: Increase workloads from initial exercise prescription for resistance, speed, and METs.;Short Term: Perform resistance training exercises routinely during rehab and add in resistance training at home;Long Term: Improve cardiorespiratory fitness, muscular endurance and strength as measured by increased METs and functional capacity (6MWT) Short Term: Increase workloads from initial exercise prescription for resistance, speed, and METs.;Short Term: Perform resistance training exercises routinely during rehab and add in resistance training at home;Long Term: Improve cardiorespiratory fitness, muscular endurance and strength as measured by increased METs and functional capacity (6MWT)     Able to understand and use rate of perceived exertion (RPE) scale Yes Yes Yes     Intervention Provide education and explanation on how to use RPE scale Provide education and explanation on how to use RPE scale Provide education and explanation on how to use RPE scale     Expected Outcomes Short Term: Able to use RPE daily in rehab to express subjective intensity level;Long Term:  Able to use RPE to guide intensity level when exercising independently Short Term: Able to use RPE daily in rehab to express subjective intensity level;Long Term:  Able to use RPE to guide intensity level when  exercising independently Short Term: Able to use RPE daily in rehab to express subjective  intensity level;Long Term:  Able to use RPE to guide intensity level when exercising independently     Knowledge and understanding of Target Heart Rate Range (THRR) Yes Yes Yes     Intervention Provide education and explanation of THRR including how the numbers were predicted and where they are located for reference Provide education and explanation of THRR including how the numbers were predicted and where they are located for reference Provide education and explanation of THRR including how the numbers were predicted and where they are located for reference     Expected Outcomes Short Term: Able to state/look up THRR;Short Term: Able to use daily as guideline for intensity in rehab;Long Term: Able to use THRR to govern intensity when exercising independently Short Term: Able to state/look up THRR;Short Term: Able to use daily as guideline for intensity in rehab;Long Term: Able to use THRR to govern intensity when exercising independently Short Term: Able to state/look up THRR;Short Term: Able to use daily as guideline for intensity in rehab;Long Term: Able to use THRR to govern intensity when exercising independently     Able to check pulse independently Yes Yes Yes     Intervention Provide education and demonstration on how to check pulse in carotid and radial arteries.;Review the importance of being able to check your own pulse for safety during independent exercise Provide education and demonstration on how to check pulse in carotid and radial arteries.;Review the importance of being able to check your own pulse for safety during independent exercise Provide education and demonstration on how to check pulse in carotid and radial arteries.;Review the importance of being able to check your own pulse for safety during independent exercise     Expected Outcomes Short Term: Able to explain why pulse checking is important during independent exercise;Long Term: Able to check pulse independently and accurately  Short Term: Able to explain why pulse checking is important during independent exercise;Long Term: Able to check pulse independently and accurately Short Term: Able to explain why pulse checking is important during independent exercise;Long Term: Able to check pulse independently and accurately     Understanding of Exercise Prescription Yes Yes Yes     Intervention Provide education, explanation, and written materials on patient's individual exercise prescription Provide education, explanation, and written materials on patient's individual exercise prescription Provide education, explanation, and written materials on patient's individual exercise prescription     Expected Outcomes Short Term: Able to explain program exercise prescription;Long Term: Able to explain home exercise prescription to exercise independently Short Term: Able to explain program exercise prescription;Long Term: Able to explain home exercise prescription to exercise independently Short Term: Able to explain program exercise prescription;Long Term: Able to explain home exercise prescription to exercise independently              Exercise Goals Re-Evaluation:  Exercise Goals Re-Evaluation     Row Name 05/22/22 1429 06/19/22 1133           Exercise Goal Re-Evaluation   Exercise Goals Review Increase Strength and Stamina;Increase Physical Activity;Able to understand and use rate of perceived exertion (RPE) scale;Knowledge and understanding of Target Heart Rate Range (THRR);Able to check pulse independently;Understanding of Exercise Prescription Increase Physical Activity;Increase Strength and Stamina;Able to understand and use rate of perceived exertion (RPE) scale;Knowledge and understanding of Target Heart Rate Range (THRR);Able to check pulse independently;Understanding of Exercise Prescription      Comments  Pt has completed 8 sessions of CR. He is egar to come to class and exercise. The last two sessions his HR has been  over 100 bpm at rest (114bpm & 108bpm) causing him to not to be able to exercise. He was currently exercising at 3.27 METs on the stepper. Will continue to monitor and progress as able. Pt has completed 22 sessions of cardiac rehab. His HR at start was raised to below 110bpm so he is able to exercise. He still continues  to be eager to come to class and exerccise. He is currently exercising at 3.39 METs on the stepper. Will continue to monitor and progress as able.      Expected Outcomes Through exercise at rehab and home, the patient will meet theirn stated goals. Through exercise at rehab and home, the patient will meet theirn stated goals.               Nutrition & Weight - Outcomes:  Pre Biometrics - 04/27/22 1023       Pre Biometrics   Height 5\' 11"  (1.803 m)    Weight 191 lb 9.3 oz (86.9 kg)    Waist Circumference 40.5 inches    Hip Circumference 43 inches    Waist to Hip Ratio 0.94 %    BMI (Calculated) 26.73    Triceps Skinfold 23 mm    % Body Fat 29.3 %    Grip Strength 23.1 kg    Flexibility 0 in    Single Leg Stand 0 seconds             Post Biometrics - 07/17/22 1013        Post  Biometrics   Height 5\' 11"  (1.803 m)    Weight 185 lb 10 oz (84.2 kg)    Waist Circumference 38 inches    Hip Circumference 32 inches    Waist to Hip Ratio 1.19 %    BMI (Calculated) 25.9    Triceps Skinfold 10 mm    % Body Fat 24.4 %    Grip Strength 26.2 kg    Flexibility 0 in    Single Leg Stand 0 seconds             Nutrition:  Nutrition Therapy & Goals - 05/15/22 1029       Personal Nutrition Goals   Comments Patient's diet assessment score was 51. We offer 2 educational sessions on heart healthy nutrition with handouts and assistance with RD referral if patient is interested.      Intervention Plan   Intervention Nutrition handout(s) given to patient.    Expected Outcomes Short Term Goal: Understand basic principles of dietary content, such as calories, fat,  sodium, cholesterol and nutrients.             Nutrition Discharge:  Nutrition Assessments - 07/24/22 1337       MEDFICTS Scores   Pre Score 51    Post Score 27    Score Difference -24             Education Questionnaire Score:  Knowledge Questionnaire Score - 07/24/22 1336       Knowledge Questionnaire Score   Pre Score 18/24    Post Score 17/24             Goals reviewed with patient; copy given to patient. Pt graduated from CR after 34 sessions. He was able to improve his walk test distance by 33.33%, and his MET level at discharge was  3.54. He has bought exercise equipment, so he can continue to exercise on his own at home.
